# Patient Record
Sex: Male | Born: 1970 | Race: White | Hispanic: No | Marital: Single | Smoking: Current every day smoker
Health system: Southern US, Community
[De-identification: ages and names within clinical notes are randomized; demographics above are authoritative.]

## PROBLEM LIST (undated history)

## (undated) DIAGNOSIS — F1010 Alcohol abuse, uncomplicated: Secondary | ICD-10-CM

## (undated) DIAGNOSIS — K7460 Unspecified cirrhosis of liver: Secondary | ICD-10-CM

## (undated) DIAGNOSIS — K922 Gastrointestinal hemorrhage, unspecified: Secondary | ICD-10-CM

## (undated) DIAGNOSIS — I10 Essential (primary) hypertension: Secondary | ICD-10-CM

## (undated) HISTORY — PX: TONSILLECTOMY: SUR1361

---

## 1997-08-27 ENCOUNTER — Inpatient Hospital Stay (HOSPITAL_COMMUNITY): Admission: EM | Admit: 1997-08-27 | Discharge: 1997-09-01 | Payer: Self-pay | Admitting: Emergency Medicine

## 2007-10-26 ENCOUNTER — Emergency Department (HOSPITAL_COMMUNITY): Admission: EM | Admit: 2007-10-26 | Discharge: 2007-10-26 | Payer: Self-pay | Admitting: Family Medicine

## 2009-06-05 ENCOUNTER — Emergency Department (HOSPITAL_COMMUNITY): Admission: EM | Admit: 2009-06-05 | Discharge: 2009-06-05 | Payer: Self-pay | Admitting: Emergency Medicine

## 2009-08-10 ENCOUNTER — Encounter: Admission: RE | Admit: 2009-08-10 | Discharge: 2009-08-10 | Payer: Self-pay | Admitting: Internal Medicine

## 2009-08-18 ENCOUNTER — Inpatient Hospital Stay (HOSPITAL_COMMUNITY): Admission: AD | Admit: 2009-08-18 | Discharge: 2009-08-22 | Payer: Self-pay | Admitting: Internal Medicine

## 2010-07-18 LAB — CBC
HCT: 38.7 % — ABNORMAL LOW (ref 39.0–52.0)
HCT: 41.2 % (ref 39.0–52.0)
HCT: 45.9 % (ref 39.0–52.0)
Hemoglobin: 15.9 g/dL (ref 13.0–17.0)
MCHC: 34.6 g/dL (ref 30.0–36.0)
MCHC: 35 g/dL (ref 30.0–36.0)
MCV: 103.7 fL — ABNORMAL HIGH (ref 78.0–100.0)
Platelets: 113 10*3/uL — ABNORMAL LOW (ref 150–400)
Platelets: 79 10*3/uL — ABNORMAL LOW (ref 150–400)
RBC: 4.42 MIL/uL (ref 4.22–5.81)
RDW: 14.5 % (ref 11.5–15.5)
RDW: 14.7 % (ref 11.5–15.5)
RDW: 14.9 % (ref 11.5–15.5)
WBC: 6.4 10*3/uL (ref 4.0–10.5)
WBC: 6.7 10*3/uL (ref 4.0–10.5)
WBC: 9.9 10*3/uL (ref 4.0–10.5)

## 2010-07-18 LAB — COMPREHENSIVE METABOLIC PANEL
ALT: 45 U/L (ref 0–53)
ALT: 48 U/L (ref 0–53)
AST: 142 U/L — ABNORMAL HIGH (ref 0–37)
AST: 75 U/L — ABNORMAL HIGH (ref 0–37)
Albumin: 2.8 g/dL — ABNORMAL LOW (ref 3.5–5.2)
Alkaline Phosphatase: 101 U/L (ref 39–117)
BUN: <1 mg/dL — ABNORMAL LOW (ref 6–23)
CO2: 29 mEq/L (ref 19–32)
Calcium: 8.5 mg/dL (ref 8.4–10.5)
Creatinine, Ser: 0.55 mg/dL (ref 0.4–1.5)
GFR calc Af Amer: >60 mL/min (ref >60)
GFR calc Af Amer: >60 mL/min (ref >60)
GFR calc Af Amer: >60 mL/min (ref >60)
Glucose, Bld: 165 mg/dL — ABNORMAL HIGH (ref 70–99)
Glucose, Bld: 181 mg/dL — ABNORMAL HIGH (ref 70–99)
Potassium: 3.4 mEq/L — ABNORMAL LOW (ref 3.5–5.1)
Potassium: 3.5 mEq/L (ref 3.5–5.1)
Potassium: 4.2 mEq/L (ref 3.5–5.1)
Sodium: 137 mEq/L (ref 135–145)
Sodium: 139 mEq/L (ref 135–145)
Total Bilirubin: 2.2 mg/dL — ABNORMAL HIGH (ref 0.3–1.2)

## 2010-07-18 LAB — GLUCOSE, CAPILLARY
Glucose-Capillary: 150 mg/dL — ABNORMAL HIGH (ref 70–99)
Glucose-Capillary: 150 mg/dL — ABNORMAL HIGH (ref 70–99)
Glucose-Capillary: 167 mg/dL — ABNORMAL HIGH (ref 70–99)
Glucose-Capillary: 172 mg/dL — ABNORMAL HIGH (ref 70–99)
Glucose-Capillary: 188 mg/dL — ABNORMAL HIGH (ref 70–99)
Glucose-Capillary: 257 mg/dL — ABNORMAL HIGH (ref 70–99)

## 2010-07-18 LAB — BASIC METABOLIC PANEL
CO2: 29 mEq/L (ref 19–32)
Calcium: 7.7 mg/dL — ABNORMAL LOW (ref 8.4–10.5)
Creatinine, Ser: 0.64 mg/dL (ref 0.4–1.5)
GFR calc non Af Amer: >60 mL/min (ref >60)
Sodium: 134 mEq/L — ABNORMAL LOW (ref 135–145)

## 2010-07-18 LAB — PROTIME-INR
INR: 1.27 (ref 0.00–1.49)
Prothrombin Time: 15.8 seconds — ABNORMAL HIGH (ref 11.6–15.2)

## 2010-07-18 LAB — MAGNESIUM: Magnesium: 1.2 mg/dL — ABNORMAL LOW (ref 1.5–2.5)

## 2010-07-20 LAB — DIFFERENTIAL
Eosinophils Relative: 0 % (ref 0–5)
Lymphs Abs: 2.9 10*3/uL (ref 0.7–4.0)
Monocytes Relative: 5 % (ref 3–12)
Neutrophils Relative %: 70 % (ref 43–77)

## 2010-07-20 LAB — CBC: Platelets: 292 10*3/uL (ref 150–400)

## 2010-07-20 LAB — COMPREHENSIVE METABOLIC PANEL
ALT: 52 U/L (ref 0–53)
AST: 128 U/L — ABNORMAL HIGH (ref 0–37)
BUN: 2 mg/dL — ABNORMAL LOW (ref 6–23)
Calcium: 7.7 mg/dL — ABNORMAL LOW (ref 8.4–10.5)
Chloride: 99 mEq/L (ref 96–112)
Creatinine, Ser: 0.52 mg/dL (ref 0.4–1.5)
GFR calc Af Amer: >60 mL/min (ref >60)
Glucose, Bld: 259 mg/dL — ABNORMAL HIGH (ref 70–99)

## 2010-07-20 LAB — URINALYSIS, ROUTINE W REFLEX MICROSCOPIC
Bilirubin Urine: NEGATIVE
Hgb urine dipstick: NEGATIVE
Ketones, ur: NEGATIVE mg/dL
Specific Gravity, Urine: 1.012 (ref 1.005–1.030)
pH: 6.5 (ref 5.0–8.0)

## 2010-07-20 LAB — ETHANOL: Alcohol, Ethyl (B): 371 mg/dL — ABNORMAL HIGH (ref 0–10)

## 2015-06-21 ENCOUNTER — Inpatient Hospital Stay (HOSPITAL_COMMUNITY): Payer: Self-pay

## 2015-06-21 ENCOUNTER — Inpatient Hospital Stay (HOSPITAL_COMMUNITY)
Admission: EM | Admit: 2015-06-21 | Discharge: 2015-06-24 | DRG: 378 | Disposition: A | Discharge status: Home / Self Care | Payer: Self-pay | Attending: Internal Medicine | Admitting: Internal Medicine

## 2015-06-21 ENCOUNTER — Encounter (HOSPITAL_COMMUNITY): Payer: Self-pay | Admitting: Emergency Medicine

## 2015-06-21 DIAGNOSIS — K922 Gastrointestinal hemorrhage, unspecified: Secondary | ICD-10-CM | POA: Diagnosis present

## 2015-06-21 DIAGNOSIS — R140 Abdominal distension (gaseous): Secondary | ICD-10-CM

## 2015-06-21 DIAGNOSIS — R221 Localized swelling, mass and lump, neck: Secondary | ICD-10-CM

## 2015-06-21 DIAGNOSIS — I10 Essential (primary) hypertension: Secondary | ICD-10-CM | POA: Diagnosis present

## 2015-06-21 DIAGNOSIS — J441 Chronic obstructive pulmonary disease with (acute) exacerbation: Secondary | ICD-10-CM

## 2015-06-21 DIAGNOSIS — K7031 Alcoholic cirrhosis of liver with ascites: Secondary | ICD-10-CM | POA: Diagnosis present

## 2015-06-21 DIAGNOSIS — I8500 Esophageal varices without bleeding: Secondary | ICD-10-CM | POA: Diagnosis present

## 2015-06-21 DIAGNOSIS — R748 Abnormal levels of other serum enzymes: Secondary | ICD-10-CM

## 2015-06-21 DIAGNOSIS — R188 Other ascites: Secondary | ICD-10-CM

## 2015-06-21 DIAGNOSIS — R000 Tachycardia, unspecified: Secondary | ICD-10-CM | POA: Diagnosis present

## 2015-06-21 DIAGNOSIS — D689 Coagulation defect, unspecified: Secondary | ICD-10-CM | POA: Diagnosis present

## 2015-06-21 DIAGNOSIS — F17210 Nicotine dependence, cigarettes, uncomplicated: Secondary | ICD-10-CM | POA: Diagnosis present

## 2015-06-21 DIAGNOSIS — E8809 Other disorders of plasma-protein metabolism, not elsewhere classified: Secondary | ICD-10-CM | POA: Diagnosis present

## 2015-06-21 DIAGNOSIS — K766 Portal hypertension: Secondary | ICD-10-CM | POA: Diagnosis present

## 2015-06-21 DIAGNOSIS — Y908 Blood alcohol level of 240 mg/100 ml or more: Secondary | ICD-10-CM | POA: Diagnosis present

## 2015-06-21 DIAGNOSIS — K921 Melena: Principal | ICD-10-CM | POA: Diagnosis present

## 2015-06-21 DIAGNOSIS — R180 Malignant ascites: Secondary | ICD-10-CM

## 2015-06-21 DIAGNOSIS — Z8249 Family history of ischemic heart disease and other diseases of the circulatory system: Secondary | ICD-10-CM

## 2015-06-21 DIAGNOSIS — R55 Syncope and collapse: Secondary | ICD-10-CM | POA: Diagnosis present

## 2015-06-21 DIAGNOSIS — D539 Nutritional anemia, unspecified: Secondary | ICD-10-CM | POA: Diagnosis present

## 2015-06-21 DIAGNOSIS — F1010 Alcohol abuse, uncomplicated: Secondary | ICD-10-CM | POA: Diagnosis present

## 2015-06-21 DIAGNOSIS — K208 Other esophagitis: Secondary | ICD-10-CM | POA: Diagnosis present

## 2015-06-21 DIAGNOSIS — K3189 Other diseases of stomach and duodenum: Secondary | ICD-10-CM | POA: Diagnosis present

## 2015-06-21 DIAGNOSIS — D696 Thrombocytopenia, unspecified: Secondary | ICD-10-CM | POA: Diagnosis present

## 2015-06-21 DIAGNOSIS — F1290 Cannabis use, unspecified, uncomplicated: Secondary | ICD-10-CM | POA: Diagnosis present

## 2015-06-21 DIAGNOSIS — R0602 Shortness of breath: Secondary | ICD-10-CM

## 2015-06-21 DIAGNOSIS — R739 Hyperglycemia, unspecified: Secondary | ICD-10-CM

## 2015-06-21 HISTORY — DX: Essential (primary) hypertension: I10

## 2015-06-21 LAB — CBC WITH DIFFERENTIAL/PLATELET
BASOS ABS: 0 10*3/uL (ref 0.0–0.1)
BASOS PCT: 0 %
EOS ABS: 0 10*3/uL (ref 0.0–0.7)
EOS PCT: 0 %
HCT: 36.5 % — ABNORMAL LOW (ref 39.0–52.0)
Hemoglobin: 12 g/dL — ABNORMAL LOW (ref 13.0–17.0)
Lymphocytes Relative: 12 %
Lymphs Abs: 1.3 10*3/uL (ref 0.7–4.0)
MCH: 34.8 pg — ABNORMAL HIGH (ref 26.0–34.0)
MCHC: 32.9 g/dL (ref 30.0–36.0)
MCV: 105.8 fL — ABNORMAL HIGH (ref 78.0–100.0)
MONO ABS: 0.8 10*3/uL (ref 0.1–1.0)
MONOS PCT: 7 %
Neutro Abs: 8.7 10*3/uL — ABNORMAL HIGH (ref 1.7–7.7)
Neutrophils Relative %: 81 %
PLATELETS: 175 10*3/uL (ref 150–400)
RBC: 3.45 MIL/uL — ABNORMAL LOW (ref 4.22–5.81)
RDW: 15.2 % (ref 11.5–15.5)
WBC: 10.7 10*3/uL — ABNORMAL HIGH (ref 4.0–10.5)

## 2015-06-21 LAB — BODY FLUID CELL COUNT WITH DIFFERENTIAL
LYMPHS FL: 9 %
MONOCYTE-MACROPHAGE-SEROUS FLUID: 89 % (ref 50–90)
NEUTROPHIL FLUID: 2 % (ref 0–25)
WBC FLUID: 144 uL (ref 0–1000)

## 2015-06-21 LAB — COMPREHENSIVE METABOLIC PANEL
ALT: 34 U/L (ref 17–63)
AST: 115 U/L — AB (ref 15–41)
Albumin: 2.9 g/dL — ABNORMAL LOW (ref 3.5–5.0)
Alkaline Phosphatase: 114 U/L (ref 38–126)
Anion gap: 14 (ref 5–15)
BUN: 23 mg/dL — AB (ref 6–20)
CHLORIDE: 99 mmol/L — AB (ref 101–111)
CO2: 23 mmol/L (ref 22–32)
CREATININE: 1.02 mg/dL (ref 0.61–1.24)
Calcium: 7.8 mg/dL — ABNORMAL LOW (ref 8.9–10.3)
GFR calc Af Amer: >60 mL/min (ref >60)
GFR calc non Af Amer: >60 mL/min (ref >60)
GLUCOSE: 198 mg/dL — AB (ref 65–99)
Potassium: 3.8 mmol/L (ref 3.5–5.1)
SODIUM: 136 mmol/L (ref 135–145)
Total Bilirubin: 2.7 mg/dL — ABNORMAL HIGH (ref 0.3–1.2)
Total Protein: 7.3 g/dL (ref 6.5–8.1)

## 2015-06-21 LAB — CBC
HCT: 33.6 % — ABNORMAL LOW (ref 39.0–52.0)
Hemoglobin: 11.3 g/dL — ABNORMAL LOW (ref 13.0–17.0)
MCH: 35.4 pg — ABNORMAL HIGH (ref 26.0–34.0)
MCHC: 33.6 g/dL (ref 30.0–36.0)
MCV: 105.3 fL — AB (ref 78.0–100.0)
PLATELETS: 127 10*3/uL — AB (ref 150–400)
RBC: 3.19 MIL/uL — AB (ref 4.22–5.81)
RDW: 15.1 % (ref 11.5–15.5)
WBC: 9.1 10*3/uL (ref 4.0–10.5)

## 2015-06-21 LAB — ETHANOL: ALCOHOL ETHYL (B): 369 mg/dL — AB (ref <5)

## 2015-06-21 LAB — TSH: TSH: 0.37 u[IU]/mL (ref 0.350–4.500)

## 2015-06-21 LAB — TYPE AND SCREEN
ABO/RH(D): A POS
ANTIBODY SCREEN: NEGATIVE

## 2015-06-21 LAB — RAPID URINE DRUG SCREEN, HOSP PERFORMED
Amphetamines: NOT DETECTED
Barbiturates: NOT DETECTED
Benzodiazepines: NOT DETECTED
COCAINE: NOT DETECTED
OPIATES: NOT DETECTED
TETRAHYDROCANNABINOL: POSITIVE — AB

## 2015-06-21 LAB — PROTIME-INR
INR: 1.67 — ABNORMAL HIGH (ref 0.00–1.49)
Prothrombin Time: 19.1 seconds — ABNORMAL HIGH (ref 11.6–15.2)

## 2015-06-21 LAB — TROPONIN I: Troponin I: <0.03 ng/mL (ref <0.031)

## 2015-06-21 LAB — GRAM STAIN

## 2015-06-21 LAB — APTT: APTT: 34 s (ref 24–37)

## 2015-06-21 LAB — MRSA PCR SCREENING: MRSA BY PCR: POSITIVE — AB

## 2015-06-21 LAB — ALBUMIN, FLUID (OTHER): Albumin, Fluid: <1 g/dL

## 2015-06-21 LAB — MAGNESIUM: Magnesium: 1.4 mg/dL — ABNORMAL LOW (ref 1.7–2.4)

## 2015-06-21 LAB — LIPASE, BLOOD: Lipase: 49 U/L (ref 11–51)

## 2015-06-21 LAB — ABO/RH: ABO/RH(D): A POS

## 2015-06-21 MED ORDER — IPRATROPIUM BROMIDE 0.02 % IN SOLN
0.5000 mg | Freq: Four times a day (QID) | RESPIRATORY_TRACT | Status: DC
  Filled 2015-06-21: qty 2.5

## 2015-06-21 MED ORDER — SODIUM CHLORIDE 0.9 % IV SOLN
8.0000 mg/h | INTRAVENOUS | Status: DC
  Administered 2015-06-21 – 2015-06-23 (×4): 8 mg/h via INTRAVENOUS
  Filled 2015-06-21 (×8): qty 80

## 2015-06-21 MED ORDER — SODIUM CHLORIDE 0.9 % IV BOLUS (SEPSIS)
1000.0000 mL | Freq: Once | INTRAVENOUS | Status: AC
  Administered 2015-06-21: 1000 mL via INTRAVENOUS

## 2015-06-21 MED ORDER — ALBUMIN HUMAN 25 % IV SOLN
25.0000 g | Freq: Once | INTRAVENOUS | Status: DC
Start: 2015-06-21 — End: 2015-06-21

## 2015-06-21 MED ORDER — ALBUMIN HUMAN 25 % IV SOLN: 12.5000 g | Freq: Once | INTRAVENOUS | Status: DC

## 2015-06-21 MED ORDER — FOLIC ACID 1 MG PO TABS
1.0000 mg | ORAL_TABLET | Freq: Every day | ORAL | Status: DC
  Administered 2015-06-22 – 2015-06-24 (×2): 1 mg via ORAL
  Filled 2015-06-21 (×2): qty 1

## 2015-06-21 MED ORDER — LEVALBUTEROL HCL 0.63 MG/3ML IN NEBU: 0.6300 mg | INHALATION_SOLUTION | Freq: Four times a day (QID) | RESPIRATORY_TRACT | Status: DC | PRN

## 2015-06-21 MED ORDER — GUAIFENESIN-DM 100-10 MG/5ML PO SYRP: 5.0000 mL | ORAL_SOLUTION | ORAL | Status: DC | PRN

## 2015-06-21 MED ORDER — VITAMIN B-1 100 MG PO TABS
100.0000 mg | ORAL_TABLET | Freq: Every day | ORAL | Status: DC
  Administered 2015-06-22 – 2015-06-24 (×2): 100 mg via ORAL
  Filled 2015-06-21 (×2): qty 1

## 2015-06-21 MED ORDER — ONDANSETRON HCL 4 MG/2ML IJ SOLN
INTRAMUSCULAR | Status: AC
  Filled 2015-06-21: qty 2

## 2015-06-21 MED ORDER — ALBUMIN HUMAN 25 % IV SOLN
37.5000 g | Freq: Once | INTRAVENOUS | Status: AC
  Administered 2015-06-21: 37.5 g via INTRAVENOUS
  Filled 2015-06-21: qty 50

## 2015-06-21 MED ORDER — ONDANSETRON HCL 4 MG PO TABS: 4.0000 mg | ORAL_TABLET | Freq: Four times a day (QID) | ORAL | Status: DC | PRN

## 2015-06-21 MED ORDER — DEXTROSE 5 % IV SOLN
2.0000 g | INTRAVENOUS | Status: DC
  Administered 2015-06-21: 2 g via INTRAVENOUS
  Filled 2015-06-21 (×2): qty 2

## 2015-06-21 MED ORDER — IOHEXOL 300 MG/ML  SOLN
100.0000 mL | Freq: Once | INTRAMUSCULAR | Status: AC | PRN
  Administered 2015-06-21: 100 mL via INTRAVENOUS

## 2015-06-21 MED ORDER — ONDANSETRON HCL 4 MG/2ML IJ SOLN: 4.0000 mg | Freq: Four times a day (QID) | INTRAMUSCULAR | Status: DC | PRN

## 2015-06-21 MED ORDER — IOHEXOL 300 MG/ML  SOLN
25.0000 mL | Freq: Once | INTRAMUSCULAR | Status: AC | PRN
  Administered 2015-06-21: 25 mL via ORAL

## 2015-06-21 MED ORDER — ACETAMINOPHEN 325 MG PO TABS: 650.0000 mg | ORAL_TABLET | Freq: Four times a day (QID) | ORAL | Status: DC | PRN

## 2015-06-21 MED ORDER — LORAZEPAM 2 MG/ML IJ SOLN
1.0000 mg | Freq: Four times a day (QID) | INTRAMUSCULAR | Status: DC | PRN
  Administered 2015-06-21 – 2015-06-23 (×2): 1 mg via INTRAVENOUS
  Filled 2015-06-21 (×2): qty 1

## 2015-06-21 MED ORDER — CHLORHEXIDINE GLUCONATE CLOTH 2 % EX PADS
6.0000 | MEDICATED_PAD | Freq: Every day | CUTANEOUS | Status: DC
Start: 2015-06-22 — End: 2015-06-24
  Administered 2015-06-24: 6 via TOPICAL

## 2015-06-21 MED ORDER — THIAMINE HCL 100 MG/ML IJ SOLN
100.0000 mg | Freq: Every day | INTRAMUSCULAR | Status: DC
  Administered 2015-06-21: 100 mg via INTRAVENOUS
  Filled 2015-06-21 (×2): qty 2

## 2015-06-21 MED ORDER — MUPIROCIN 2 % EX OINT
1.0000 "application " | TOPICAL_OINTMENT | Freq: Two times a day (BID) | CUTANEOUS | Status: DC
  Administered 2015-06-22 – 2015-06-24 (×5): 1 via NASAL
  Filled 2015-06-21 (×3): qty 22

## 2015-06-21 MED ORDER — SODIUM CHLORIDE 0.9 % IV SOLN
80.0000 mg | Freq: Once | INTRAVENOUS | Status: AC
  Administered 2015-06-21: 80 mg via INTRAVENOUS
  Filled 2015-06-21: qty 80

## 2015-06-21 MED ORDER — DEXTROSE 5 % IV SOLN: 2.0000 g | Freq: Once | INTRAVENOUS | Status: DC

## 2015-06-21 MED ORDER — CEFTRIAXONE SODIUM 2 G IJ SOLR: 2.0000 g | INTRAMUSCULAR | Status: DC

## 2015-06-21 MED ORDER — LORAZEPAM 1 MG PO TABS
1.0000 mg | ORAL_TABLET | Freq: Four times a day (QID) | ORAL | Status: DC | PRN
  Administered 2015-06-21: 1 mg via ORAL
  Filled 2015-06-21: qty 1

## 2015-06-21 MED ORDER — METHYLPREDNISOLONE SODIUM SUCC 40 MG IJ SOLR
40.0000 mg | Freq: Two times a day (BID) | INTRAMUSCULAR | Status: DC
  Administered 2015-06-21 – 2015-06-22 (×3): 40 mg via INTRAVENOUS
  Filled 2015-06-21 (×3): qty 1

## 2015-06-21 MED ORDER — DEXTROSE 5 % IV SOLN
2.0000 g | INTRAVENOUS | Status: DC
  Filled 2015-06-21: qty 2

## 2015-06-21 MED ORDER — OCTREOTIDE LOAD VIA INFUSION
50.0000 ug | Freq: Once | INTRAVENOUS | Status: AC
Start: 2015-06-21 — End: 2015-06-21
  Administered 2015-06-21: 50 ug via INTRAVENOUS
  Filled 2015-06-21: qty 25

## 2015-06-21 MED ORDER — NICOTINE 7 MG/24HR TD PT24
7.0000 mg | MEDICATED_PATCH | Freq: Every day | TRANSDERMAL | Status: DC
  Administered 2015-06-21 – 2015-06-23 (×3): 7 mg via TRANSDERMAL
  Filled 2015-06-21 (×4): qty 1

## 2015-06-21 MED ORDER — OXYCODONE HCL 5 MG PO TABS
5.0000 mg | ORAL_TABLET | ORAL | Status: DC | PRN
  Administered 2015-06-21 (×2): 5 mg via ORAL
  Filled 2015-06-21 (×2): qty 1

## 2015-06-21 MED ORDER — SODIUM CHLORIDE 0.9% FLUSH
3.0000 mL | Freq: Two times a day (BID) | INTRAVENOUS | Status: DC
  Administered 2015-06-21 – 2015-06-24 (×6): 3 mL via INTRAVENOUS

## 2015-06-21 MED ORDER — ALBUMIN HUMAN 25 % IV SOLN
50.0000 g | Freq: Once | INTRAVENOUS | Status: AC
  Administered 2015-06-21: 50 g via INTRAVENOUS
  Filled 2015-06-21: qty 200

## 2015-06-21 MED ORDER — PANTOPRAZOLE SODIUM 40 MG IV SOLR
40.0000 mg | Freq: Two times a day (BID) | INTRAVENOUS | Status: DC
Start: 2015-06-21 — End: 2015-06-21
  Administered 2015-06-21: 40 mg via INTRAVENOUS
  Filled 2015-06-21: qty 40

## 2015-06-21 MED ORDER — ONDANSETRON HCL 4 MG/2ML IJ SOLN
4.0000 mg | Freq: Once | INTRAMUSCULAR | Status: AC
  Administered 2015-06-21: 4 mg via INTRAVENOUS

## 2015-06-21 MED ORDER — OCTREOTIDE ACETATE 500 MCG/ML IJ SOLN
50.0000 ug/h | INTRAMUSCULAR | Status: DC
  Administered 2015-06-21 – 2015-06-22 (×4): 50 ug/h via INTRAVENOUS
  Filled 2015-06-21 (×10): qty 1

## 2015-06-21 MED ORDER — GUAIFENESIN ER 600 MG PO TB12
600.0000 mg | ORAL_TABLET | Freq: Two times a day (BID) | ORAL | Status: DC
  Administered 2015-06-21 – 2015-06-24 (×5): 600 mg via ORAL
  Filled 2015-06-21 (×5): qty 1

## 2015-06-21 MED ORDER — SODIUM CHLORIDE 0.9 % IV SOLN
INTRAVENOUS | Status: DC
  Administered 2015-06-21: 12:00:00 via INTRAVENOUS
  Administered 2015-06-22: 1000 mL via INTRAVENOUS

## 2015-06-21 MED ORDER — PANTOPRAZOLE SODIUM 40 MG IV SOLR
40.0000 mg | Freq: Two times a day (BID) | INTRAVENOUS | Status: DC
Start: 2015-06-25 — End: 2015-06-21

## 2015-06-21 MED ORDER — SODIUM CHLORIDE 0.9 % IV BOLUS (SEPSIS)
250.0000 mL | Freq: Once | INTRAVENOUS | Status: DC
Start: 2015-06-21 — End: 2015-06-24

## 2015-06-21 MED ORDER — ADULT MULTIVITAMIN W/MINERALS CH
1.0000 | ORAL_TABLET | Freq: Every day | ORAL | Status: DC
  Administered 2015-06-22 – 2015-06-24 (×2): 1 via ORAL
  Filled 2015-06-21 (×2): qty 1

## 2015-06-21 MED ORDER — ACETAMINOPHEN 650 MG RE SUPP
650.0000 mg | Freq: Four times a day (QID) | RECTAL | Status: DC | PRN
Start: 2015-06-21 — End: 2015-06-24

## 2015-06-21 NOTE — ED Notes (Signed)
Pt ambulated to restroom. Was unaware he needed a urine sample. Pt will notify when he is able to void again.

## 2015-06-21 NOTE — H&P (Addendum)
[facility] History and Physical  JAQUES MINEER JYN:[id number] DOB: May 12, 1970 DOA: 06/21/2015  Referring physician: *  PCP: No primary care provider on file.   Chief Complaint: Shortness of breath   HPI:  45 year old male with a history of alcoholism, nicotine dependence, brought in via EMS because of a near-syncopal episode, after he had coffee-ground emesis that started 2-3 days ago. Patient has also noted melena for the last 1 week. He is also noticed increasing abdominal distention for the last several weeks. He also complains of exertional dyspnea and wheezing. He smokes about 1 pack a day. Patient was initially hypertensive with a blood pressure in the 180s, subsequently blood pressure was 132 /95. Patient found to be tachycardic in the 140s. Patient denies any recent weight loss. Patient has also noticed a lump along the right side of his neck which has been increasing in size for the last couple of years. Patient admits to drinking on a regular basis and mostly drinks beer and vodka. Patient found to be tachycardic but otherwise hemodynamically stable, hemoglobin 12.0, creatinine 1.02, bilirubin 2.7. White blood cell count 10.7. EtOH level 369. INR 1.67     Review of Systems: negative for the following  Gen: Denies any fever, chills, rigors, night sweats, anorexia, fatigue, weakness, malaise, involuntary weight loss, and sleep disorder CV: Denies chest pain, angina, palpitations, syncope, orthopnea, PND, peripheral edema, and claudication. Resp: Denies dyspnea, cough, sputum, wheezing, coughing up blood. GI: Described in detail in HPI.  GU : Denies urinary burning, blood in urine, urinary frequency, urinary hesitancy, nocturnal urination, and urinary incontinence. MS: Denies joint pain or swelling. Denies muscle weakness, cramps, atrophy.  Derm: Denies rash, itching, oral ulcerations, hives, unhealing ulcers.  Psych: Denies depression, anxiety, memory loss, suicidal  ideation, hallucinations, and confusion. Heme: Denies bruising, bleeding, and enlarged lymph nodes. Neuro: Denies any headaches, dizziness, paresthesias. Endo: Denies any problems with DM, thyroid, adrenal function   Past Medical History  Diagnosis Date  . Hypertension      Past Surgical History  Procedure Laterality Date  . Tonsillectomy        Social History:  reports that he has been smoking Cigarettes.  He has never used smokeless tobacco. He reports that he drinks alcohol. He reports that he uses illicit drugs (Marijuana).    No Known Allergies  Family History  Problem Relation Age of Onset  . Hypertension Father          Prior to Admission medications   Medication Sig Start Date End Date Taking? Authorizing Provider  diphenhydrAMINE (SOMINEX) 25 MG tablet Take 50 mg by mouth at bedtime as needed for sleep.   Yes Historical Provider, MD     Physical Exam: Filed Vitals:   06/21/15 1118 06/21/15 1126 06/21/15 1359  BP: 129/99  112/78  Pulse: 115 160 144  Temp: 98.1 F (36.7 C)  97.8 F (36.6 C)  TempSrc: Oral  Oral  Resp: 22  30  SpO2: 98%  100%     Constitutional: Vital signs reviewed. Patient is a well-developed and well-nourished in no acute distress and cooperative with exam. Alert and oriented x3.  Head: Normocephalic and atraumatic  Ear: TM normal bilaterally  Mouth: no erythema or exudates, MMM  Eyes: PERRL, EOMI, conjunctivae normal, No scleral icterus.  Neck: Supple, Trachea midline normal ROM, No JVD, mass, thyromegaly, or carotid bruit present.  Cardiovascular: Tachycardic, S1 normal, S2 normal, no MRG, pulses symmetric and intact bilaterally  Pulmonary/Chest: CTAB, no wheezes, rales, or  rhonchi  Abdominal: Soft. Non-tender, non-distended, bowel sounds are normal, no masses, organomegaly, or guarding present.  GU: no CVA tenderness Musculoskeletal: No joint deformities, erythema, or stiffness, ROM full and no nontender Ext: no edema and  no cyanosis, pulses palpable bilaterally (DP and PT)  Hematology: no cervical, inginal, or axillary adenopathy.  Neurological: A&O x3, Strenght is normal and symmetric bilaterally, cranial nerve II-XII are grossly intact, no focal motor deficit, sensory intact to light touch bilaterally.  Skin: Multiple tattoos, scattered ecchymoses, truncal telangiectasias  Psychiatric: Normal mood and affect. speech and behavior is normal. Judgment and thought content normal. Cognition and memory are normal.      Data Review   Micro Results No results found for this or any previous visit (from the past 240 hour(s)).  Radiology Reports No results found.   CBC  Recent Labs Lab 06/21/15 1208  WBC 10.7*  HGB 12.0*  HCT 36.5*  PLT 175  MCV 105.8*  MCH 34.8*  MCHC 32.9  RDW 15.2  LYMPHSABS 1.3  MONOABS 0.8  EOSABS 0.0  BASOSABS 0.0    Chemistries   Recent Labs Lab 06/21/15 1200  NA 136  K 3.8  CL 99*  CO2 23  GLUCOSE 198*  BUN 23*  CREATININE 1.02  CALCIUM 7.8*  AST 115*  ALT 34  ALKPHOS 114  BILITOT 2.7*   ------------------------------------------------------------------------------------------------------------------ CrCl cannot be calculated (Unknown ideal weight.). ------------------------------------------------------------------------------------------------------------------ No results for input(s): HGBA1C in the last 72 hours. ------------------------------------------------------------------------------------------------------------------ No results for input(s): CHOL, HDL, LDLCALC, TRIG, CHOLHDL, LDLDIRECT in the last 72 hours. ------------------------------------------------------------------------------------------------------------------ No results for input(s): TSH, T4TOTAL, T3FREE, THYROIDAB in the last 72 hours.  Invalid input(s): FREET3 ------------------------------------------------------------------------------------------------------------------ No  results for input(s): VITAMINB12, FOLATE, FERRITIN, TIBC, IRON, RETICCTPCT in the last 72 hours.  Coagulation profile  Recent Labs Lab 06/21/15 1200  INR 1.67*    No results for input(s): DDIMER in the last 72 hours.  Cardiac Enzymes No results for input(s): CKMB, TROPONINI, MYOGLOBIN in the last 168 hours.  Invalid input(s): CK ------------------------------------------------------------------------------------------------------------------ Invalid input(s): POCBNP   CBG: No results for input(s): GLUCAP in the last 168 hours.     EKG: Independently reviewed.  *   Assessment/Plan Principal Problem:   Acute GI bleeding Patient presents with coffee-ground emesis, likely secondary to esophagitis versus portal hypertensive gastropathy in the setting of daily alcohol use Patient will be admitted to step down   and patient has been initiated on octreotide and Protonix drip Serial CBC, if hemoglobin drops less than 10 we'll transfuse in the setting of ongoing melena Gastroenterology, Dr. Dulce Sellar has been consulted  CT of the abdomen and pelvis to evaluate for cirrhosis, portal hypertension  Ascites likely secondary to alcoholic cirrhosis Patient started on Rocephin for SBP prophylaxis Dr. Dulce Sellar recommends albumin administration followed by paracentesis Check the sciatic fluid cell count, differential, culture, cytology  Alcohol abuse We'll start the patient on CIWA protocol   Macrocytic anemia, thrombocytopenia Likely in the setting of alcohol use, avoid heparin products  Coagulopathy of the liver Suspect likely secondary to alcoholic cirrhosis of the liver Follow PT/INR  Acute COPD exacerbation Patient will be started on IV steroids, nebulizer treatments Continue empiric Rocephin CT of the chest to evaluate for underlying lung mass, possible pulmonary embolism  Lump in the neck CT of the head and neck to rule out underlying malignancy         Code Status  Orders        Start     Ordered  06/21/15 1441  Full code   Continuous     06/21/15 1441    Code Status History    Date Active Date Inactive Code Status Order ID Comments User Context   This patient has a current code status but no historical code status.      Family Communication: Discussed with the patient's mother and the patient by the bedside Disposition Plan: admit   Total time spent 55 minutes.Greater than 50% of this time was spent in counseling, explanation of diagnosis, planning of further management, and coordination of care  [facility] Pager [phone]  If 7PM-7AM, please contact night-coverage [email or web address] Password [facility] 06/21/2015, 2:44 PM

## 2015-06-21 NOTE — Consult Note (Signed)
[facility] Consultation Note  Referring Provider: [facility] Primary Care Physician:  No primary care provider on file.  Reason for Consultation:  Abdominal distention, coffee ground emesis, melena  HPI: Derrick [name] is a 45 y.o. male whom we've been asked to see for above reasons.  He actually presented to ED because of a three-week duration of abdominal distention and discomfort.  He also endorses 3-day history of black emesis and black stools.  No frankly red hematemesis or hematochezia.  He has been heavy alcohol consumer for over 20 years.  Chronic back and generalized pain, and takes NSAIDs occasionally.  Denies prior liver disease or endoscopy or colonoscopy.  Denies fevers or unintentional weight loss.  Labs available below; many other labs still pending.   Past Medical History  Diagnosis Date  . Hypertension     History reviewed. No pertinent past surgical history.  Prior to Admission medications   Medication Sig Start Date End Date Taking? Authorizing Provider  diphenhydrAMINE (SOMINEX) 25 MG tablet Take 50 mg by mouth at bedtime as needed for sleep.   Yes Historical Provider, MD    Current Facility-Administered Medications  Medication Dose Route Frequency Provider Last Rate Last Dose  . 0.9 %  sodium chloride infusion   Intravenous Continuous Lorre Nick, MD 125 mL/hr at 06/21/15 1159    . octreotide (SANDOSTATIN) 500 mcg in sodium chloride 0.9 % 250 mL (2 mcg/mL) infusion  50 mcg/hr Intravenous Continuous Lorre Nick, MD 25 mL/hr at 06/21/15 1234 50 mcg/hr at 06/21/15 1234   Current Outpatient Prescriptions  Medication Sig Dispense Refill  . diphenhydrAMINE (SOMINEX) 25 MG tablet Take 50 mg by mouth at bedtime as needed for sleep.      Allergies as of 06/21/2015  . (No Known Allergies)    History reviewed. No pertinent family history.  Social History   Social History  . Marital Status: Single    Spouse Name: N/A  . Number of Children:  N/A  . Years of Education: N/A   Occupational History  . Not on file.   Social History Main Topics  . Smoking status: Not on file  . Smokeless tobacco: Not on file  . Alcohol Use: Not on file  . Drug Use: Not on file  . Sexual Activity: Not on file   Other Topics Concern  . Not on file   Social History Narrative  . No narrative on file    Review of Systems: Positive = bold Gen: Denies any fever, chills, rigors, night sweats, anorexia, fatigue, weakness, malaise, involuntary weight loss, and sleep disorder CV: Denies chest pain, angina, palpitations, syncope, orthopnea, PND, peripheral edema, and claudication. Resp: Denies dyspnea, cough, sputum, wheezing, coughing up blood. GI: Described in detail in HPI.    GU : Denies urinary burning, blood in urine, urinary frequency, urinary hesitancy, nocturnal urination, and urinary incontinence. MS: Denies joint pain or swelling.  Denies muscle weakness, cramps, atrophy.  Derm: Denies rash, itching, oral ulcerations, hives, unhealing ulcers.  Psych: Denies depression, anxiety, memory loss, suicidal ideation, hallucinations,  and confusion. Heme: Denies bruising, bleeding, and enlarged lymph nodes. Neuro:  Denies any headaches, dizziness, paresthesias. Endo:  Denies any problems with DM, thyroid, adrenal function.  Physical Exam: Vital signs in last 24 hours: Temp:  [98.1 F (36.7 C)] 98.1 F (36.7 C) (02/21 1118) Pulse Rate:  [115-160] 160 (02/21 1126) Resp:  [22] 22 (02/21 1118) BP: (129)/(99) 129/99 mmHg (02/21 1118) SpO2:  [98 %] 98 % (02/21 1118)  General:   Alert, smells of alcohol, Older-appearing than stated age, deconditioned and disheveled-appearing Head:  Normocephalic and atraumatic. Eyes:  Sclera clear, no icterus.   Conjunctiva pink. Ears:  Normal auditory acuity. Nose:  No deformity, discharge,  or lesions. Mouth:  No deformity or lesions.  Oropharynx pink but dry Neck:  Supple; no masses or thyromegaly. Lungs:   Clear throughout to auscultation.   No wheezes, crackles, or rhonchi. No acute distress. Heart:  Tachycardic but regular; no murmurs, clicks, rubs,  or gallops. Abdomen:  Soft, moderately distended, no tympany, + shifting dullness noted, small reducible umbilical hernia. No masses, hepatosplenomegaly or hernias noted. Bowel sounds noted   Msk:  Symmetrical without gross deformities. Normal posture. Pulses:  Normal pulses noted. Extremities:  Without clubbing or edema. Neurologic:  Alert and  oriented x4;  Diffusely weak, otherwise grossly normal neurologically. Skin:  Multiple tattoos, scattered ecchymoses, truncal telangiectasias seen, otherwise intact without significant lesions or rashes. Psych:  Alert and cooperative. Depressed mood, flat affect   Lab Results:  Recent Labs  06/21/15 1208  WBC 10.7*  HGB 12.0*  HCT 36.5*  PLT 175   BMET No results for input(s): NA, K, CL, CO2, GLUCOSE, BUN, CREATININE, CALCIUM in the last 72 hours. LFT No results for input(s): PROT, ALBUMIN, AST, ALT, ALKPHOS, BILITOT, BILIDIR, IBILI in the last 72 hours. PT/INR  Recent Labs  06/21/15 1200  LABPROT 19.1*  INR 1.67*    Studies/Results: No results found.  Impression:  1.  Coffee ground emesis. Suspect esophagitis versus portal hypertensive gastropathy.  Doubt frank variceal bleeding. 2.  Dark stools.  Same principal differential diagnosis as #1 above. 3.  Abdominal distention.  In conjunction with patient's elevated MCV, low platelets and significant alcohol history, suspect alcohol-mediated cirrhosis with ascites. 4.  Elevated LFTs.  Suspect cirrhosis, as above. 5.  Alcohol abuse.  Plan:  1.  Hydration with IV fluids. 2.  PPI and octreotide for now. 3.  No NSAIDs/blood thinners. 4.  Abdominal ultrasound to assess for changes of cirrhosis + portal hypertension, and to assess for ascites. 5.  Antibiotics for now, for presumed GI bleeding in midst of ascites. 6.  If ascites seen on  ultrasound, would obtain paracentesis for diagnostic (albumin, culture, WBC) and therapeutic purposes. 7.  No immediate need for endoscopy.  Will plan on doing endoscopy in the next 1-2 days, pending our endoscopy unit's availability. 8.  [facility] will follow.     Freddy Jaksch  06/21/2015, 1:34 PM  Pager [phone] If no answer or after 5 PM call [phone]

## 2015-06-21 NOTE — Procedures (Signed)
Ultrasound-guided diagnostic and therapeutic paracentesis performed yielding 5.1 liters of clear yellow colored fluid. No immediate complications.  Margaret Staggs E 4:20 PM 06/21/2015

## 2015-06-21 NOTE — ED Notes (Signed)
Pt transferred to IR unit for procedure, scheduled meds will be delayed.

## 2015-06-21 NOTE — ED Provider Notes (Signed)
CSN: [id number]     Arrival date & time 06/21/15  1107 History   First MD Initiated Contact with Patient 06/21/15 1134     Chief Complaint  Patient presents with  . Hematemesis  . Rectal Bleeding  . Tachycardia     (Consider location/radiation/quality/duration/timing/severity/associated sxs/prior Treatment) HPI Comments: Patient here complaining of increased abdominal distention as well as dark stools times several days. He also notes vomiting dark material. Patient is a heavy consumer of alcohol and his last drink was today which consisted of 2 large shots of vodka. Denies any alcohol withdrawal symptoms. Denies any illicit drug use. No fever or chills. Denies any severe abdominal pain. No prior history of cirrhosis or ascites. EMS was called and patient was found to be tachycardic to a rate of 130. Blood pressure was 132/95. Was given IV fluids per EMS, 150 mL of normal saline, and transported here.  Patient is a 45 y.o. male presenting with hematochezia. The history is provided by the patient.  Rectal Bleeding   Past Medical History  Diagnosis Date  . Hypertension    History reviewed. No pertinent past surgical history. History reviewed. No pertinent family history. Social History  Substance Use Topics  . Smoking status: None  . Smokeless tobacco: None  . Alcohol Use: None    Review of Systems  Gastrointestinal: Positive for hematochezia.  All other systems reviewed and are negative.     Allergies  Review of patient's allergies indicates no known allergies.  Home Medications   Prior to Admission medications   Medication Sig Start Date End Date Taking? Authorizing Provider  diphenhydrAMINE (SOMINEX) 25 MG tablet Take 50 mg by mouth at bedtime as needed for sleep.   Yes Historical Provider, MD   BP 129/99 mmHg  Pulse 115  Temp(Src) 98.1 F (36.7 C) (Oral)  Resp 22  SpO2 98% Physical Exam  Constitutional: He is oriented to person, place, and time. He appears  well-developed and well-nourished.  Non-toxic appearance. No distress.  HENT:  Head: Normocephalic and atraumatic.  Eyes: Conjunctivae, EOM and lids are normal. Pupils are equal, round, and reactive to light.  Neck: Normal range of motion. Neck supple. No tracheal deviation present. No thyroid mass present.  Cardiovascular: Regular rhythm and normal heart sounds.  Tachycardia present.  Exam reveals no gallop.   No murmur heard. Pulmonary/Chest: Effort normal and breath sounds normal. No stridor. No respiratory distress. He has no decreased breath sounds. He has no wheezes. He has no rhonchi. He has no rales.  Abdominal: Normal appearance. He exhibits distension and ascites. There is no tenderness. There is no rigidity, no rebound, no guarding and no CVA tenderness.  Musculoskeletal: Normal range of motion. He exhibits no edema or tenderness.  Neurological: He is alert and oriented to person, place, and time. He has normal strength. No cranial nerve deficit or sensory deficit. GCS eye subscore is 4. GCS verbal subscore is 5. GCS motor subscore is 6.  Skin: Skin is warm and dry. No abrasion and no rash noted.  Psychiatric: He has a normal mood and affect. His speech is normal and behavior is normal.  Nursing note and vitals reviewed.   ED Course  Procedures (including critical care time) Labs Review Labs Reviewed  CBC WITH DIFFERENTIAL/PLATELET  COMPREHENSIVE METABOLIC PANEL  LIPASE, BLOOD  ETHANOL  URINE RAPID DRUG SCREEN, HOSP PERFORMED  APTT  PROTIME-INR  TYPE AND SCREEN    Imaging Review No results found. I have personally reviewed and evaluated  these images and lab results as part of my medical decision-making.   EKG Interpretation None      MDM   Final diagnoses:  None    Patient tachycardic here and given IV fluids. Started on octreotide for suspected there so bleed. Discussed with Dr. Dulce Sellar, gastroenterologist on call, will see the patient in consultation. Patient  to be admitted to the stepdown unit    Lorre Nick, MD 06/21/15 1422

## 2015-06-21 NOTE — ED Notes (Signed)
Per EMS, pt from home, hx of alcoholism. Mother called d/t near syncopal episode after vomiting dark black emesis. Has also been having dark tarry stools, with two days of increasing abdominal distention. He is positive for positional respiratory distress when laying down. Hx of HTN with pressures in the 180s, BP in route 132/95, per mother this skin color is normal for him. Does not appear pale in triage, tachycardic in route at 130.  IV started by EMS in route, given 150 ml NS.

## 2015-06-21 NOTE — Progress Notes (Signed)
Pharmacy Antibiotic Follow-up Note  JERRIE GULLO is a 45 y.o. year-old male admitted on 06/21/2015.  The patient is currently on day 1 of Rocephin  for intra-abdominal infection.  Assessment/Plan: Rocephin 2gm x1, then continue 2gm q24 tomorrow  Temp (24hrs), Avg:98.1 F (36.7 C), Min:98.1 F (36.7 C), Max:98.1 F (36.7 C)   Recent Labs Lab 06/21/15 1208  WBC 10.7*    Recent Labs Lab 06/21/15 1200  CREATININE 1.02   CrCl cannot be calculated (Unknown ideal weight.).    No Known Allergies  Antimicrobials this admission: 2/21 Rocephin >>   Levels/dose changes this admission:  Microbiology results: no cx ordered  Thank you for allowing pharmacy to be a part of this patient's care.  Otho Bellows PharmD 06/21/2015 1:58 PM

## 2015-06-22 DIAGNOSIS — R221 Localized swelling, mass and lump, neck: Secondary | ICD-10-CM

## 2015-06-22 LAB — CBC
HCT: 28.8 % — ABNORMAL LOW (ref 39.0–52.0)
HCT: 29.6 % — ABNORMAL LOW (ref 39.0–52.0)
HEMOGLOBIN: 9.8 g/dL — AB (ref 13.0–17.0)
Hemoglobin: 10.1 g/dL — ABNORMAL LOW (ref 13.0–17.0)
MCH: 36.6 pg — ABNORMAL HIGH (ref 26.0–34.0)
MCH: 35.1 pg — ABNORMAL HIGH (ref 26.0–34.0)
MCHC: 35.1 g/dL (ref 30.0–36.0)
MCHC: 33.1 g/dL (ref 30.0–36.0)
MCV: 104.3 fL — ABNORMAL HIGH (ref 78.0–100.0)
MCV: 106.1 fL — ABNORMAL HIGH (ref 78.0–100.0)
PLATELETS: 99 10*3/uL — AB (ref 150–400)
Platelets: 93 10*3/uL — ABNORMAL LOW (ref 150–400)
RBC: 2.76 MIL/uL — AB (ref 4.22–5.81)
RBC: 2.79 MIL/uL — ABNORMAL LOW (ref 4.22–5.81)
RDW: 14.9 % (ref 11.5–15.5)
RDW: 15.2 % (ref 11.5–15.5)
WBC: 8.3 10*3/uL (ref 4.0–10.5)
WBC: 6.7 10*3/uL (ref 4.0–10.5)

## 2015-06-22 LAB — COMPREHENSIVE METABOLIC PANEL
ALT: 26 U/L (ref 17–63)
AST: 90 U/L — ABNORMAL HIGH (ref 15–41)
Albumin: 2.8 g/dL — ABNORMAL LOW (ref 3.5–5.0)
Alkaline Phosphatase: 77 U/L (ref 38–126)
Anion gap: 13 (ref 5–15)
BUN: 14 mg/dL (ref 6–20)
CHLORIDE: 105 mmol/L (ref 101–111)
CO2: 22 mmol/L (ref 22–32)
CREATININE: 0.72 mg/dL (ref 0.61–1.24)
Calcium: 6.9 mg/dL — ABNORMAL LOW (ref 8.9–10.3)
GFR calc non Af Amer: >60 mL/min (ref >60)
Glucose, Bld: 179 mg/dL — ABNORMAL HIGH (ref 65–99)
Potassium: 4.5 mmol/L (ref 3.5–5.1)
Sodium: 140 mmol/L (ref 135–145)
Total Bilirubin: 3 mg/dL — ABNORMAL HIGH (ref 0.3–1.2)
Total Protein: 6 g/dL — ABNORMAL LOW (ref 6.5–8.1)

## 2015-06-22 LAB — TROPONIN I

## 2015-06-22 MED ORDER — IPRATROPIUM BROMIDE 0.02 % IN SOLN: 0.5000 mg | Freq: Four times a day (QID) | RESPIRATORY_TRACT | Status: DC | PRN

## 2015-06-22 MED ORDER — DEXTROSE 5 % IV SOLN: 1.0000 g | INTRAVENOUS | Status: DC

## 2015-06-22 MED ORDER — LORAZEPAM 2 MG/ML IJ SOLN
1.0000 mg | Freq: Once | INTRAMUSCULAR | Status: AC
  Administered 2015-06-22: 1 mg via INTRAVENOUS
  Filled 2015-06-22: qty 1

## 2015-06-22 MED ORDER — PREDNISONE 20 MG PO TABS
40.0000 mg | ORAL_TABLET | Freq: Every day | ORAL | Status: DC
  Administered 2015-06-23 – 2015-06-24 (×2): 40 mg via ORAL
  Filled 2015-06-22 (×2): qty 2

## 2015-06-22 MED ORDER — MAGNESIUM SULFATE 2 GM/50ML IV SOLN
2.0000 g | Freq: Once | INTRAVENOUS | Status: AC
  Administered 2015-06-22: 2 g via INTRAVENOUS
  Filled 2015-06-22: qty 50

## 2015-06-22 NOTE — Progress Notes (Signed)
PROGRESS NOTE  Derrick WOON WUJ:[id number] DOB: 07-24-1970 DOA: 06/21/2015 PCP: No primary care provider on file.  45 year old male with a history of alcoholism, nicotine dependence, brought in via EMS because of a near-syncopal episode, after he had coffee-ground emesis that started 2-3 days ago. Patient has also noted melena for the last 1 week. He is also noticed increasing abdominal distention for the last several weeks. He also complains of exertional dyspnea and wheezing. He smokes about 1 pack a day. Patient was initially hypertensive with a blood pressure in the 180s, subsequently blood pressure was 132 /95. Patient found to be tachycardic in the 140s. Patient denies any recent weight loss. Patient has also noticed a lump along the right side of his neck which has been increasing in size for the last couple of years. Patient admits to drinking on a regular basis and mostly drinks beer and vodka.   Assessment/Plan:  Acute GI bleeding -presented with coffee-ground emesis, likely secondary to esophagitis versus portal hypertensive gastropathy in the setting of daily alcohol use -octreotide and Protonix drip -Serial CBC Gastroenterology, Dr. Dulce Sellar has been consulted  U/S shows: Cirrhosis as well as an indeterminate hypoechoic 3.7 cm focus in the central liver, cannot exclude a liver mass.  -serum AFP tumor marker ordered  Ascites likely secondary to alcoholic cirrhosis D/c rocephin Dr. Dulce Sellar recommends albumin administration followed by paracentesis (5.1L removed)  Alcohol abuse - CIWA protocol  Macrocytic anemia, thrombocytopenia Likely in the setting of alcohol use, avoid heparin products  Coagulopathy of the liver Suspect likely secondary to alcoholic cirrhosis of the liver Follow PT/INR  Acute COPD exacerbation -change to PO steroids, nebulizer treatments D/c abx  Lump in the neck - likely dermal inclusion cyst or other benign lesion. Location is readily amenable to  excisional biopsy-outpatient  SCDs for DVT prophylaxis  Code Status: full Family Communication: patient Disposition Plan:    Consultants:  GI  IR  Procedures:  paracentesis    HPI/Subjective: Does not plan to quit drinking but will cut back severely   Objective: Filed Vitals:   06/22/15 0500 06/22/15 0600  BP:  135/86  Pulse: 115 115  Temp:    Resp: 16 15    Intake/Output Summary (Last 24 hours) at 06/22/15 0802 Last data filed at 06/22/15 0600  Gross per 24 hour  Intake 2220.41 ml  Output      0 ml  Net 2220.41 ml   Filed Weights   06/21/15 2010  Weight: 75.6 kg (166 lb 10.7 oz)    Exam:   General:  Awake, tremulous  Cardiovascular: tachy  Respiratory: no wheezing  Abdomen: +BS, soft  Musculoskeletal: no edema   Data Reviewed: Basic Metabolic Panel:  Recent Labs Lab 06/21/15 1200 06/21/15 1300 06/22/15 0338  NA 136  --  140  K 3.8  --  4.5  CL 99*  --  105  CO2 23  --  22  GLUCOSE 198*  --  179*  BUN 23*  --  14  CREATININE 1.02  --  0.72  CALCIUM 7.8*  --  6.9*  MG  --  1.4*  --    Liver Function Tests:  Recent Labs Lab 06/21/15 1200 06/22/15 0338  AST 115* 90*  ALT 34 26  ALKPHOS 114 77  BILITOT 2.7* 3.0*  PROT 7.3 6.0*  ALBUMIN 2.9* 2.8*    Recent Labs Lab 06/21/15 1200  LIPASE 49   No results for input(s): AMMONIA in the last 168 hours. CBC:  Recent Labs Lab 06/21/15 1208 06/21/15 1820 06/21/15 2300 06/22/15 0338  WBC 10.7* 9.1 8.3 6.7  NEUTROABS 8.7*  --   --   --   HGB 12.0* 11.3* 10.1* 9.8*  HCT 36.5* 33.6* 28.8* 29.6*  MCV 105.8* 105.3* 104.3* 106.1*  PLT 175 127* 99* 93*   Cardiac Enzymes:  Recent Labs Lab 06/21/15 1300 06/21/15 2034 06/22/15 0338  TROPONINI <0.03 <0.03 <0.03   BNP (last 3 results) No results for input(s): BNP in the last 8760 hours.  ProBNP (last 3 results) No results for input(s): PROBNP in the last 8760 hours.  CBG: No results for input(s): GLUCAP in the last  168 hours.  Recent Results (from the past 240 hour(s))  Gram stain     Status: None   Collection Time: 06/21/15  3:49 PM  Result Value Ref Range Status   Specimen Description FLUID PERITONEAL  Final   Special Requests NONE  Final   Gram Stain   Final    MODERATE WBC PRESENT, PREDOMINANTLY MONONUCLEAR NO ORGANISMS SEEN Performed at [facility]    Report Status 06/21/2015 FINAL  Final  MRSA PCR Screening     Status: Abnormal   Collection Time: 06/21/15  8:13 PM  Result Value Ref Range Status   MRSA by PCR POSITIVE (A) NEGATIVE Final    Comment:        The GeneXpert MRSA Assay (FDA approved for NASAL specimens only), is one component of a comprehensive MRSA colonization surveillance program. It is not intended to diagnose MRSA infection nor to guide or monitor treatment for MRSA infections. RESULT CALLED TO, READ BACK BY AND VERIFIED WITH: Joaquin Bend [id number] @ 2127 BY J SCOTTON      Studies: Ct Soft Tissue Neck W Contrast  06/21/2015  CLINICAL DATA:  Vomiting and black emesis. Abdominal distention. Respiratory distress. Lump on right side of neck which has been increasing for a couple of years. EXAM: CT OF THE NECK WITH CONTRAST CT OF THE CHEST WITH CONTRAST CT OF THE ABDOMEN AND PELVIS WITH CONTRAST TECHNIQUE: Multidetector CT imaging of the neck was performed with intravenous contrast.; Multidetector CT imaging of the abdomen and pelvis was performed following the standard protocol during bolus administration of intravenous contrast.; Multidetector CT imaging of the chest was performed following the standard protocol during bolus administration of intravenous contrast. CONTRAST:  OMNIPAQUE IOHEXOL 300 MG/ML  SOLN COMPARISON:  None. FINDINGS: CT NECK FINDINGS The right neck palpable abnormality correlates with a ovoid immediately subcutaneous low-density mass with smooth, measuring 18 mm. This contacts the posterior margin of the sternocleidomastoid. No  superimposed inflammatory changes. Pharynx and larynx: No suspicious enhancement or other asymmetry. Salivary glands: Negative Thyroid: Negative Lymph nodes: No enlarged or necrotic appearing lymph nodes. Low-density rounded in tubular structures in the left supraclavicular fossa which are likely lymphatic. Vascular: Carotid bifurcation atherosclerosis without evidence of flow limiting stenosis. Limited intracranial: Negative Visualized orbits: Part visualization is negative Mastoids and visualized paranasal sinuses: Clear Skeleton: No acute or aggressive process CT CHEST FINDINGS THORACIC INLET/BODY WALL: No acute abnormality. MEDIASTINUM: Borderline cardiomegaly. No pericardial effusion. Atherosclerosis, extensive along the LAD, age advanced. No acute vascular abnormality. Diffuse mild thickening of the patulous esophagus without discrete varix. No adenopathy. LUNG WINDOWS: There is no edema, consolidation, effusion, or pneumothorax. Mild paraseptal emphysema at the apices. OSSEOUS: No acute fracture.  No suspicious lytic or blastic lesions. CT ABDOMEN PELVIS FINDINGS Hepatobiliary: Cirrhotic liver morphology with large fissures, liver capsule undulation, hypertrophied hepatic  artery, and ovegrown caudate. No masslike enhancement is seen. Patchy low density compatible with steatosis. No evidence of biliary obstruction or stone. Pancreas: Unremarkable. Spleen: Unremarkable.  No enlargement. Adrenals/Urinary Tract: Negative adrenals. 3 mm stone at the left ureteral vesicular junction with no hydronephrosis. Posterior hilar lip cyst on the left measuring 15 mm . Prominent bladder wall thickness without superimposed fat inflammation Reproductive:No pathologic findings. Stomach/Bowel: The bowel is diffusely thickened, from the stomach to the rectum, with submucosal low density edema. Mesenteric vessels are engorged. There could be small mural varices at the GE junction, but limited by oral contrast in the rugae. No  obstruction. No appendicitis. Vascular/Lymphatic: No acute vascular abnormality. Premature atherosclerosis. No portal venous occlusion. No mass or adenopathy. Peritoneal: Small ascites. 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 IMPRESSION: 1. Cirrhotic liver morphology with steatosis. 2. Gastric, small bowel, and colonic thickening with submucosal edema. Given diffuse appearance favor portal hypertension with hypoproteinemia over infectious/inflammatory process. 3. Non loculated ascites.  Recent paracentesis. 4. Nonobstructive 3 mm stone at the left ureteral vesicular junction. 5. The right neck palpable complaint correlates with a 18 mm subcutaneous cyst, likely dermal inclusion cyst or other benign lesion. Location is readily amenable to excisional biopsy. 6. No acute cardiopulmonary disease. Age advanced atherosclerosis, extensive in the coronary circulation. Electronically Signed   By: Marnee Spring M.D.   On: 06/21/2015 18:12   Ct Chest W Contrast  06/21/2015  CLINICAL DATA:  Vomiting and black emesis. Abdominal distention. Respiratory distress. Lump on right side of neck which has been increasing for a couple of years. EXAM: CT OF THE NECK WITH CONTRAST CT OF THE CHEST WITH CONTRAST CT OF THE ABDOMEN AND PELVIS WITH CONTRAST TECHNIQUE: Multidetector CT imaging of the neck was performed with intravenous contrast.; Multidetector CT imaging of the abdomen and pelvis was performed following the standard protocol during bolus administration of intravenous contrast.; Multidetector CT imaging of the chest was performed following the standard protocol during bolus administration of intravenous contrast. CONTRAST:  OMNIPAQUE IOHEXOL 300 MG/ML  SOLN COMPARISON:  None. FINDINGS: CT NECK FINDINGS The right neck palpable abnormality correlates with a ovoid immediately subcutaneous low-density mass with smooth, measuring 18 mm. This contacts the posterior margin of the  sternocleidomastoid. No superimposed inflammatory changes. Pharynx and larynx: No suspicious enhancement or other asymmetry. Salivary glands: Negative Thyroid: Negative Lymph nodes: No enlarged or necrotic appearing lymph nodes. Low-density rounded in tubular structures in the left supraclavicular fossa which are likely lymphatic. Vascular: Carotid bifurcation atherosclerosis without evidence of flow limiting stenosis. Limited intracranial: Negative Visualized orbits: Part visualization is negative Mastoids and visualized paranasal sinuses: Clear Skeleton: No acute or aggressive process CT CHEST FINDINGS THORACIC INLET/BODY WALL: No acute abnormality. MEDIASTINUM: Borderline cardiomegaly. No pericardial effusion. Atherosclerosis, extensive along the LAD, age advanced. No acute vascular abnormality. Diffuse mild thickening of the patulous esophagus without discrete varix. No adenopathy. LUNG WINDOWS: There is no edema, consolidation, effusion, or pneumothorax. Mild paraseptal emphysema at the apices. OSSEOUS: No acute fracture.  No suspicious lytic or blastic lesions. CT ABDOMEN PELVIS FINDINGS Hepatobiliary: Cirrhotic liver morphology with large fissures, liver capsule undulation, hypertrophied hepatic artery, and ovegrown caudate. No masslike enhancement is seen. Patchy low density compatible with steatosis. No evidence of biliary obstruction or stone. Pancreas: Unremarkable. Spleen: Unremarkable.  No enlargement. Adrenals/Urinary Tract: Negative adrenals. 3 mm stone at the left ureteral vesicular junction with no hydronephrosis. Posterior hilar lip cyst on the left measuring 15 mm . Prominent bladder wall thickness  without superimposed fat inflammation Reproductive:No pathologic findings. Stomach/Bowel: The bowel is diffusely thickened, from the stomach to the rectum, with submucosal low density edema. Mesenteric vessels are engorged. There could be small mural varices at the GE junction, but limited by oral  contrast in the rugae. No obstruction. No appendicitis. Vascular/Lymphatic: No acute vascular abnormality. Premature atherosclerosis. No portal venous occlusion. No mass or adenopathy. Peritoneal: Small ascites. 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 IMPRESSION: 1. Cirrhotic liver morphology with steatosis. 2. Gastric, small bowel, and colonic thickening with submucosal edema. Given diffuse appearance favor portal hypertension with hypoproteinemia over infectious/inflammatory process. 3. Non loculated ascites.  Recent paracentesis. 4. Nonobstructive 3 mm stone at the left ureteral vesicular junction. 5. The right neck palpable complaint correlates with a 18 mm subcutaneous cyst, likely dermal inclusion cyst or other benign lesion. Location is readily amenable to excisional biopsy. 6. No acute cardiopulmonary disease. Age advanced atherosclerosis, extensive in the coronary circulation. Electronically Signed   By: Marnee Spring M.D.   On: 06/21/2015 18:12   Ct Abdomen Pelvis W Contrast  06/21/2015  CLINICAL DATA:  Vomiting and black emesis. Abdominal distention. Respiratory distress. Lump on right side of neck which has been increasing for a couple of years. EXAM: CT OF THE NECK WITH CONTRAST CT OF THE CHEST WITH CONTRAST CT OF THE ABDOMEN AND PELVIS WITH CONTRAST TECHNIQUE: Multidetector CT imaging of the neck was performed with intravenous contrast.; Multidetector CT imaging of the abdomen and pelvis was performed following the standard protocol during bolus administration of intravenous contrast.; Multidetector CT imaging of the chest was performed following the standard protocol during bolus administration of intravenous contrast. CONTRAST:  OMNIPAQUE IOHEXOL 300 MG/ML  SOLN COMPARISON:  None. FINDINGS: CT NECK FINDINGS The right neck palpable abnormality correlates with a ovoid immediately subcutaneous low-density mass with smooth, measuring 18 mm. This contacts the  posterior margin of the sternocleidomastoid. No superimposed inflammatory changes. Pharynx and larynx: No suspicious enhancement or other asymmetry. Salivary glands: Negative Thyroid: Negative Lymph nodes: No enlarged or necrotic appearing lymph nodes. Low-density rounded in tubular structures in the left supraclavicular fossa which are likely lymphatic. Vascular: Carotid bifurcation atherosclerosis without evidence of flow limiting stenosis. Limited intracranial: Negative Visualized orbits: Part visualization is negative Mastoids and visualized paranasal sinuses: Clear Skeleton: No acute or aggressive process CT CHEST FINDINGS THORACIC INLET/BODY WALL: No acute abnormality. MEDIASTINUM: Borderline cardiomegaly. No pericardial effusion. Atherosclerosis, extensive along the LAD, age advanced. No acute vascular abnormality. Diffuse mild thickening of the patulous esophagus without discrete varix. No adenopathy. LUNG WINDOWS: There is no edema, consolidation, effusion, or pneumothorax. Mild paraseptal emphysema at the apices. OSSEOUS: No acute fracture.  No suspicious lytic or blastic lesions. CT ABDOMEN PELVIS FINDINGS Hepatobiliary: Cirrhotic liver morphology with large fissures, liver capsule undulation, hypertrophied hepatic artery, and ovegrown caudate. No masslike enhancement is seen. Patchy low density compatible with steatosis. No evidence of biliary obstruction or stone. Pancreas: Unremarkable. Spleen: Unremarkable.  No enlargement. Adrenals/Urinary Tract: Negative adrenals. 3 mm stone at the left ureteral vesicular junction with no hydronephrosis. Posterior hilar lip cyst on the left measuring 15 mm . Prominent bladder wall thickness without superimposed fat inflammation Reproductive:No pathologic findings. Stomach/Bowel: The bowel is diffusely thickened, from the stomach to the rectum, with submucosal low density edema. Mesenteric vessels are engorged. There could be small mural varices at the GE junction,  but limited by oral contrast in the rugae. No obstruction. No appendicitis. Vascular/Lymphatic: No acute vascular abnormality. Premature atherosclerosis. No  portal venous occlusion. No mass or adenopathy. Peritoneal: Small ascites. 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 IMPRESSION: 1. Cirrhotic liver morphology with steatosis. 2. Gastric, small bowel, and colonic thickening with submucosal edema. Given diffuse appearance favor portal hypertension with hypoproteinemia over infectious/inflammatory process. 3. Non loculated ascites.  Recent paracentesis. 4. Nonobstructive 3 mm stone at the left ureteral vesicular junction. 5. The right neck palpable complaint correlates with a 18 mm subcutaneous cyst, likely dermal inclusion cyst or other benign lesion. Location is readily amenable to excisional biopsy. 6. No acute cardiopulmonary disease. Age advanced atherosclerosis, extensive in the coronary circulation. Electronically Signed   By: Marnee Spring M.D.   On: 06/21/2015 18:12   US Paracentesis  06/21/2015  INDICATION: Patient with a near syncopal episode and possible coffee-ground emesis this morning. He is brought to the emergency apartment via EMS. He was noted to have abdominal distention with elevated liver function test and felt to likely have ascites. Paracentesis was requested. EXAM: ULTRASOUND GUIDED DIAGNOSTIC AND THERAPEUTIC PARACENTESIS MEDICATIONS: 1% lidocaine COMPLICATIONS: None immediate. PROCEDURE: Informed written consent was obtained from the patient after a discussion of the risks, benefits and alternatives to treatment. A timeout was performed prior to the initiation of the procedure. Initial ultrasound scanning demonstrates a large amount of ascites within the left lower abdominal quadrant. The left lower abdomen was prepped and draped in the usual sterile fashion. 1% lidocaine was used for local anesthesia. Following this, a Safe-T-Centesis catheter was  introduced. An ultrasound image was saved for documentation purposes. The paracentesis was performed. The catheter was removed and a dressing was applied. The patient tolerated the procedure well without immediate post procedural complication. FINDINGS: A total of approximately 5.1 L of clear yellow fluid was removed. Samples were sent to the laboratory as requested by the clinical team. IMPRESSION: Successful ultrasound-guided paracentesis yielding 5.1 liters of peritoneal fluid. Read by: Barnetta Chapel, PA-C Electronically Signed   By: Corlis Leak M.D.   On: 06/21/2015 16:23   US Abdomen Limited Ruq  06/21/2015  CLINICAL DATA:  Abdominal distention. Elevated liver function tests. Ascites. Post paracentesis earlier today. EXAM: US ABDOMEN LIMITED - RIGHT UPPER QUADRANT COMPARISON:  06/21/2015 CT abdomen/pelvis. FINDINGS: Gallbladder: Nondistended gallbladder. Moderate diffuse gallbladder wall thickening (6 mm in thickness). Layering sludge. No evidence of cholelithiasis. Pericholecystic fluid. No sonographic Murphy sign. Common bile duct: Diameter: 5 mm Liver: Liver surface is diffusely finely irregular and the liver parenchyma is diffusely coarsened in echotexture, in keeping with cirrhosis. There is a hypoechoic 3.4 x 3.7 x 3.4 cm focus measured by the technologist in the central liver, which is indeterminate, cannot exclude a liver mass in this location. The main portal vein is patent with appropriate flow direction. There is small to moderate perihepatic ascites. IMPRESSION: 1. Cirrhosis. 2. Indeterminate hypoechoic 3.7 cm focus in the central liver, cannot exclude a liver mass. Recommend correlation with serum AFP. A liver mass protocol MRI abdomen with and without IV contrast should be performed on a short term outpatient basis for further evaluation. If MRI is not feasible or contraindicated, a triphasic protocol CT of the abdomen with and without IV contrast is recommended. 3. Small to moderate volume  perihepatic ascites. 4. Nondistended thick-walled gallbladder with layering sludge. No cholelithiasis. No sonographic Murphy sign. The gallbladder wall thickening is likely reactive or due to non inflammatory edema (i.e. due to portal hypertension and/or hypoalbuminemia). 5. No biliary ductal dilatation. Electronically Signed   By: Jannifer Rodney.D.  On: 06/21/2015 18:06    Scheduled Meds: . cefTRIAXone (ROCEPHIN)  IV  2 g Intravenous Q24H  . Chlorhexidine Gluconate Cloth  6 each Topical Q0600  . folic acid  1 mg Oral Daily  . guaiFENesin  600 mg Oral BID  . methylPREDNISolone (SOLU-MEDROL) injection  40 mg Intravenous Q12H  . multivitamin with minerals  1 tablet Oral Daily  . mupirocin ointment  1 application Nasal BID  . nicotine  7 mg Transdermal Daily  . sodium chloride  250 mL Intravenous Once  . sodium chloride flush  3 mL Intravenous Q12H  . thiamine  100 mg Oral Daily   Or  . thiamine  100 mg Intravenous Daily   Continuous Infusions: . sodium chloride 125 mL/hr at 06/21/15 1159  . octreotide  (SANDOSTATIN)    IV infusion 50 mcg/hr (06/21/15 2208)  . pantoprozole (PROTONIX) infusion 8 mg/hr (06/22/15 0551)   Antibiotics Given (last 72 hours)    None      Principal Problem:   Acute GI bleeding Active Problems:   GI bleed   COPD with acute exacerbation (HCC)   Lump in neck   Ascites of liver    Time spent: 35 min    Nivan Melendrez U [facility]  [facility] Pager [phone]. If 7PM-7AM, please contact night-coverage at [email or web address], password [facility] 06/22/2015, 8:02 AM  LOS: 1 day

## 2015-06-22 NOTE — Progress Notes (Signed)
Subjective: Feels much better after paracentesis. No further hematemesis. Is hungry.  Objective: Vital signs in last 24 hours: Temp:  [97.8 F (36.6 C)-99.3 F (37.4 C)] 97.9 F (36.6 C) (02/22 0800) Pulse Rate:  [114-160] 114 (02/22 0800) Resp:  [15-30] 16 (02/22 0800) BP: (110-156)/(76-107) 153/96 mmHg (02/22 0800) SpO2:  [92 %-100 %] 96 % (02/22 0800) Weight:  [75.6 kg (166 lb 10.7 oz)] 75.6 kg (166 lb 10.7 oz) (02/21 2010) Weight change:  Last BM Date: 06/22/15  PE: GEN:  Older-appearing than stated age. NECK:  Right soft nodule neck ABD:  Less distended, non-tender  Lab Results: CBC    Component Value Date/Time   WBC 6.7 06/22/2015 0338   RBC 2.79* 06/22/2015 0338   HGB 9.8* 06/22/2015 0338   HCT 29.6* 06/22/2015 0338   PLT 93* 06/22/2015 0338   MCV 106.1* 06/22/2015 0338   MCH 35.1* 06/22/2015 0338   MCHC 33.1 06/22/2015 0338   RDW 15.2 06/22/2015 0338   LYMPHSABS 1.3 06/21/2015 1208   MONOABS 0.8 06/21/2015 1208   EOSABS 0.0 06/21/2015 1208   BASOSABS 0.0 06/21/2015 1208   CMP     Component Value Date/Time   NA 140 06/22/2015 0338   K 4.5 06/22/2015 0338   CL 105 06/22/2015 0338   CO2 22 06/22/2015 0338   GLUCOSE 179* 06/22/2015 0338   BUN 14 06/22/2015 0338   CREATININE 0.72 06/22/2015 0338   CALCIUM 6.9* 06/22/2015 0338   PROT 6.0* 06/22/2015 0338   ALBUMIN 2.8* 06/22/2015 0338   AST 90* 06/22/2015 0338   ALT 26 06/22/2015 0338   ALKPHOS 77 06/22/2015 0338   BILITOT 3.0* 06/22/2015 0338   GFRNONAA >60 06/22/2015 0338   GFRAA >60 06/22/2015 0338   Assessment:  1.  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 2.  Ascites, paracentesis SAAG > 1.1, most consistent with portal hypertension (cirrhosis-related).  No SBP. 3.  Bowel edema, suspect from hypoalbuminemia. 4.  Black emesis.  Resolved. 5.  Anemia. 6.  Alcohol abuse.  Plan:  1.  Clear liquid diet, advance to full liquids today as tolerated. 2.  Continue PPI and octreotide today. 3.   Endoscopy tomorrow for further investigation of dark stool and black emesis. 4.  Alcohol abstinence discussed at length with patient. 5.  [facility] will follow.   Derrick [name] 06/22/2015, 9:38 AM   Pager [phone] If no answer or after 5 PM call [phone]

## 2015-06-22 NOTE — Progress Notes (Signed)
Pharmacy Antibiotic Follow-up Note  Derrick [name] is a 45 y.o. year-old male admitted on 06/21/2015.  The patient is currently on day 1 of Rocephin  for intra-abdominal infection.  Today, 06/22/2015: Temp: afebrile (max 99.3) WBC: improved to wnl Renal: CrCl wnl  Assessment/Plan:  Per GI, doesn't appear to be SBP.  Will reduce ceftriaxone to 1g IV q24 hr for prophylaxis of SBP.  No further adjustments anticipated; will sign off.  Please reconsult if clinical status changes warrant pharmacy intervention  Temp (24hrs), Avg:98.5 F (36.9 C), Min:97.9 F (36.6 C), Max:99.3 F (37.4 C)   Recent Labs Lab 06/21/15 1208 06/21/15 1820 06/21/15 2300 06/22/15 0338  WBC 10.7* 9.1 8.3 6.7     Recent Labs Lab 06/21/15 1200 06/22/15 0338  CREATININE 1.02 0.72   Estimated Creatinine Clearance: 116.4 mL/min (by C-G formula based on Cr of 0.72).    No Known Allergies   Antimicrobials this admission: 2/21 Rocephin >>  Dose adjustments this admission: 2/21: reduced to 1 q 24 for ppx dosing  Microbiology results: 2/21 Peritoneal fluid: ngtd; no orgs seen on Gm stain - AFB sent MRSA nasal swab: POSITIVE  Thank you for allowing pharmacy to be a part of this patient's care.  Bernadene Person, PharmD, BCPS Pager: [phone] 06/22/2015, 3:05 PM

## 2015-06-22 NOTE — Care Management Note (Signed)
Case Management Note  Patient Details  Name: EARLE TROIANO MRN: [id number] Date of Birth: [birth year]  Subjective/Objective:             Gi bleed, ascites       Action/Plan:Date: June 22, 2015 Chart reviewed for concurrent status and case management needs. Will continue to follow patient for changes and needs: Marcelle Smiling, BSN, RN, [place]   [phone]   Expected Discharge Date:   (unknown)               Expected Discharge Plan:  Home/Self Care  In-House Referral:  NA  Discharge planning Services  CM Consult  Post Acute Care Choice:  NA Choice offered to:  NA  DME Arranged:  N/A DME Agency:  NA  HH Arranged:  NA HH Agency:  NA  Status of Service:  Completed, signed off  Medicare Important Message Given:    Date Medicare IM Given:    Medicare IM give by:    Date Additional Medicare IM Given:    Additional Medicare Important Message give by:     If discussed at Long Length of Stay Meetings, dates discussed:    Additional Comments:  Golda Acre, RN 06/22/2015, 10:41 AM

## 2015-06-23 ENCOUNTER — Encounter (HOSPITAL_COMMUNITY)
Admission: EM | Disposition: A | Discharge status: Home / Self Care | Payer: Self-pay | Source: Home / Self Care | Attending: Internal Medicine

## 2015-06-23 ENCOUNTER — Inpatient Hospital Stay (HOSPITAL_COMMUNITY): Payer: MEDICAID | Admitting: Anesthesiology

## 2015-06-23 ENCOUNTER — Encounter (HOSPITAL_COMMUNITY): Payer: Self-pay

## 2015-06-23 HISTORY — PX: ESOPHAGOGASTRODUODENOSCOPY (EGD) WITH PROPOFOL: SHX5813

## 2015-06-23 LAB — CBC
HEMATOCRIT: 29.2 % — AB (ref 39.0–52.0)
HEMOGLOBIN: 9.8 g/dL — AB (ref 13.0–17.0)
MCH: 35.6 pg — AB (ref 26.0–34.0)
MCHC: 33.6 g/dL (ref 30.0–36.0)
MCV: 106.2 fL — AB (ref 78.0–100.0)
Platelets: 71 10*3/uL — ABNORMAL LOW (ref 150–400)
RBC: 2.75 MIL/uL — ABNORMAL LOW (ref 4.22–5.81)
RDW: 14.9 % (ref 11.5–15.5)
WBC: 4 10*3/uL (ref 4.0–10.5)

## 2015-06-23 LAB — AFP TUMOR MARKER: AFP-Tumor Marker: 3.2 ng/mL (ref 0.0–8.3)

## 2015-06-23 LAB — BASIC METABOLIC PANEL
Anion gap: 10 (ref 5–15)
BUN: 14 mg/dL (ref 6–20)
CALCIUM: 6.7 mg/dL — AB (ref 8.9–10.3)
CHLORIDE: 103 mmol/L (ref 101–111)
CO2: 25 mmol/L (ref 22–32)
CREATININE: 0.65 mg/dL (ref 0.61–1.24)
GFR calc non Af Amer: >60 mL/min (ref >60)
GLUCOSE: 236 mg/dL — AB (ref 65–99)
Potassium: 3.9 mmol/L (ref 3.5–5.1)
Sodium: 138 mmol/L (ref 135–145)

## 2015-06-23 LAB — GLUCOSE, CAPILLARY
GLUCOSE-CAPILLARY: 188 mg/dL — AB (ref 65–99)
GLUCOSE-CAPILLARY: 262 mg/dL — AB (ref 65–99)
GLUCOSE-CAPILLARY: 137 mg/dL — AB (ref 65–99)

## 2015-06-23 SURGERY — ESOPHAGOGASTRODUODENOSCOPY (EGD) WITH PROPOFOL
Anesthesia: Monitor Anesthesia Care

## 2015-06-23 MED ORDER — PROPOFOL 10 MG/ML IV BOLUS
INTRAVENOUS | Status: DC | PRN
  Administered 2015-06-23: 40 mg via INTRAVENOUS
  Administered 2015-06-23: 20 mg via INTRAVENOUS
  Administered 2015-06-23: 40 mg via INTRAVENOUS

## 2015-06-23 MED ORDER — PROPRANOLOL HCL 10 MG PO TABS
10.0000 mg | ORAL_TABLET | Freq: Two times a day (BID) | ORAL | Status: DC
  Administered 2015-06-23 – 2015-06-24 (×3): 10 mg via ORAL
  Filled 2015-06-23 (×6): qty 1

## 2015-06-23 MED ORDER — FUROSEMIDE 40 MG PO TABS
40.0000 mg | ORAL_TABLET | Freq: Every day | ORAL | Status: DC
  Administered 2015-06-23 – 2015-06-24 (×2): 40 mg via ORAL
  Filled 2015-06-23 (×2): qty 1

## 2015-06-23 MED ORDER — SPIRONOLACTONE 25 MG PO TABS
100.0000 mg | ORAL_TABLET | Freq: Every day | ORAL | Status: DC
  Administered 2015-06-23 – 2015-06-24 (×2): 100 mg via ORAL
  Filled 2015-06-23 (×2): qty 4

## 2015-06-23 MED ORDER — PROPOFOL 500 MG/50ML IV EMUL
INTRAVENOUS | Status: DC | PRN
  Administered 2015-06-23: 125 ug/kg/min via INTRAVENOUS

## 2015-06-23 MED ORDER — CIPROFLOXACIN HCL 500 MG PO TABS
500.0000 mg | ORAL_TABLET | Freq: Two times a day (BID) | ORAL | Status: DC
  Administered 2015-06-23 – 2015-06-24 (×3): 500 mg via ORAL
  Filled 2015-06-23 (×6): qty 1

## 2015-06-23 MED ORDER — INSULIN ASPART 100 UNIT/ML ~~LOC~~ SOLN
0.0000 [IU] | Freq: Three times a day (TID) | SUBCUTANEOUS | Status: DC
  Administered 2015-06-23: 5 [IU] via SUBCUTANEOUS
  Administered 2015-06-24: 1 [IU] via SUBCUTANEOUS

## 2015-06-23 MED ORDER — PANTOPRAZOLE SODIUM 40 MG PO TBEC
40.0000 mg | DELAYED_RELEASE_TABLET | Freq: Every day | ORAL | Status: DC
  Administered 2015-06-23 – 2015-06-24 (×2): 40 mg via ORAL
  Filled 2015-06-23 (×3): qty 1

## 2015-06-23 MED ORDER — LACTATED RINGERS IV SOLN
INTRAVENOUS | Status: DC | PRN
  Administered 2015-06-23: 09:00:00 via INTRAVENOUS

## 2015-06-23 MED ORDER — PROPOFOL 10 MG/ML IV BOLUS
INTRAVENOUS | Status: AC
  Filled 2015-06-23: qty 60

## 2015-06-23 MED ORDER — HYDRALAZINE HCL 20 MG/ML IJ SOLN
10.0000 mg | Freq: Four times a day (QID) | INTRAMUSCULAR | Status: DC | PRN
  Administered 2015-06-23: 10 mg via INTRAVENOUS
  Filled 2015-06-23: qty 1

## 2015-06-23 MED ORDER — PROPOFOL 10 MG/ML IV BOLUS
INTRAVENOUS | Status: AC
  Filled 2015-06-23: qty 20

## 2015-06-23 SURGICAL SUPPLY — 14 items

## 2015-06-23 NOTE — Progress Notes (Signed)
Pt arrived to unit in w/c. Stood ambulated to bed w/ CG assist. Oriented to callbell and environment. POC discussed. VSS. Denies pain/discomfort at present. Contact precautions maintained.  Tele box 31 confirmed x 2 with CCMD.

## 2015-06-23 NOTE — Progress Notes (Signed)
PROGRESS NOTE  Derrick [name] AVW:[id number] DOB: Mar 13, 1971 DOA: 06/21/2015 PCP: No primary care provider on file.  45 year old male with a history of alcoholism, nicotine dependence, brought in via EMS because of a near-syncopal episode, after he had coffee-ground emesis that started 2-3 days ago. Patient has also noted melena for the last 1 week. He is also noticed increasing abdominal distention for the last several weeks. He also complains of exertional dyspnea and wheezing. He smokes about 1 pack a day. Patient was initially hypertensive with a blood pressure in the 180s, subsequently blood pressure was 132 /95. Patient found to be tachycardic in the 140s. Patient denies any recent weight loss. Patient has also noticed a lump along the right side of his neck which has been increasing in size for the last couple of years. Patient admits to drinking on a regular basis and mostly drinks beer and vodka.   Assessment/Plan:  Acute GI bleeding -presented with coffee-ground emesis, likely secondary to esophagitis versus portal hypertensive gastropathy in the setting of daily alcohol use -octreotide and Protonix drip- d/c'd -Serial CBC Gastroenterology, Dr. Dulce Sellar has been consulted  U/S shows: Cirrhosis as well as an indeterminate hypoechoic 3.7 cm focus in the central liver, cannot exclude a liver mass.  -serum AFP tumor marker negative -cipro for SBP prophylaxis watch Qtc -propanolol  Ascites likely secondary to alcoholic cirrhosis D/c rocephin Dr. Dulce Sellar recommends albumin administration followed by paracentesis (5.1L removed) -lasix/aldactone  Alcohol abuse - CIWA protocol  HTN -PRns Lasix/aldactone  Macrocytic anemia, thrombocytopenia Likely in the setting of alcohol use, avoid heparin products  Coagulopathy of the liver Suspect likely secondary to alcoholic cirrhosis of the liver  Acute COPD exacerbation -change to PO steroids, nebulizer treatments D/c  abx  Hyperglycemia -SSI -check HgbA1C  Lump in the neck - likely dermal inclusion cyst or other benign lesion. Location is readily amenable to excisional biopsy-outpatient  SCDs for DVT prophylaxis  Care management for follow up   Code Status: full Family Communication: patient Disposition Plan: home in AM   Consultants:  GI  IR  Procedures:  paracentesis    HPI/Subjective: Says his blood sugars have been "boarderline" in past Has put out 3L urine since EGD   Objective: Filed Vitals:   06/23/15 1144 06/23/15 1200  BP:  181/105  Pulse:  81  Temp: 98.1 F (36.7 C)   Resp:  11    Intake/Output Summary (Last 24 hours) at 06/23/15 1425 Last data filed at 06/23/15 1200  Gross per 24 hour  Intake 4963.33 ml  Output   3200 ml  Net 1763.33 ml   Filed Weights   06/21/15 2010 06/23/15 0825  Weight: 75.6 kg (166 lb 10.7 oz) 75.297 kg (166 lb)    Exam:   General:  Awake, tremulous  Cardiovascular: tachy  Respiratory: no wheezing  Abdomen: +BS, soft  Musculoskeletal: no edema   Data Reviewed: Basic Metabolic Panel:  Recent Labs Lab 06/21/15 1200 06/21/15 1300 06/22/15 0338 06/23/15 0326  NA 136  --  140 138  K 3.8  --  4.5 3.9  CL 99*  --  105 103  CO2 23  --  22 25  GLUCOSE 198*  --  179* 236*  BUN 23*  --  14 14  CREATININE 1.02  --  0.72 0.65  CALCIUM 7.8*  --  6.9* 6.7*  MG  --  1.4*  --   --    Liver Function Tests:  Recent Labs Lab 06/21/15 1200 06/22/15  0338  AST 115* 90*  ALT 34 26  ALKPHOS 114 77  BILITOT 2.7* 3.0*  PROT 7.3 6.0*  ALBUMIN 2.9* 2.8*    Recent Labs Lab 06/21/15 1200  LIPASE 49   No results for input(s): AMMONIA in the last 168 hours. CBC:  Recent Labs Lab 06/21/15 1208 06/21/15 1820 06/21/15 2300 06/22/15 0338 06/23/15 0326  WBC 10.7* 9.1 8.3 6.7 4.0  NEUTROABS 8.7*  --   --   --   --   HGB 12.0* 11.3* 10.1* 9.8* 9.8*  HCT 36.5* 33.6* 28.8* 29.6* 29.2*  MCV 105.8* 105.3* 104.3* 106.1*  106.2*  PLT 175 127* 99* 93* 71*   Cardiac Enzymes:  Recent Labs Lab 06/21/15 1300 06/21/15 2034 06/22/15 0338  TROPONINI <0.03 <0.03 <0.03   BNP (last 3 results) No results for input(s): BNP in the last 8760 hours.  ProBNP (last 3 results) No results for input(s): PROBNP in the last 8760 hours.  CBG: No results for input(s): GLUCAP in the last 168 hours.  Recent Results (from the past 240 hour(s))  Culture, body fluid-bottle     Status: None (Preliminary result)   Collection Time: 06/21/15  3:49 PM  Result Value Ref Range Status   Specimen Description FLUID PERITONEAL  Final   Special Requests BOTTLES DRAWN AEROBIC AND ANAEROBIC 10CC  Final   Culture   Final    NO GROWTH 2 DAYS Performed at [facility]    Report Status PENDING  Incomplete  Gram stain     Status: None   Collection Time: 06/21/15  3:49 PM  Result Value Ref Range Status   Specimen Description FLUID PERITONEAL  Final   Special Requests NONE  Final   Gram Stain   Final    MODERATE WBC PRESENT, PREDOMINANTLY MONONUCLEAR NO ORGANISMS SEEN Performed at [facility]    Report Status 06/21/2015 FINAL  Final  MRSA PCR Screening     Status: Abnormal   Collection Time: 06/21/15  8:13 PM  Result Value Ref Range Status   MRSA by PCR POSITIVE (A) NEGATIVE Final    Comment:        The GeneXpert MRSA Assay (FDA approved for NASAL specimens only), is one component of a comprehensive MRSA colonization surveillance program. It is not intended to diagnose MRSA infection nor to guide or monitor treatment for MRSA infections. RESULT CALLED TO, READ BACK BY AND VERIFIED WITH: Joaquin Bend [id number] @ 2127 BY J SCOTTON      Studies: Ct Soft Tissue Neck W Contrast  06/21/2015  CLINICAL DATA:  Vomiting and black emesis. Abdominal distention. Respiratory distress. Lump on right side of neck which has been increasing for a couple of years. EXAM: CT OF THE NECK WITH CONTRAST CT OF THE CHEST WITH  CONTRAST CT OF THE ABDOMEN AND PELVIS WITH CONTRAST TECHNIQUE: Multidetector CT imaging of the neck was performed with intravenous contrast.; Multidetector CT imaging of the abdomen and pelvis was performed following the standard protocol during bolus administration of intravenous contrast.; Multidetector CT imaging of the chest was performed following the standard protocol during bolus administration of intravenous contrast. CONTRAST:  OMNIPAQUE IOHEXOL 300 MG/ML  SOLN COMPARISON:  None. FINDINGS: CT NECK FINDINGS The right neck palpable abnormality correlates with a ovoid immediately subcutaneous low-density mass with smooth, measuring 18 mm. This contacts the posterior margin of the sternocleidomastoid. No superimposed inflammatory changes. Pharynx and larynx: No suspicious enhancement or other asymmetry. Salivary glands: Negative Thyroid: Negative Lymph nodes: No  enlarged or necrotic appearing lymph nodes. Low-density rounded in tubular structures in the left supraclavicular fossa which are likely lymphatic. Vascular: Carotid bifurcation atherosclerosis without evidence of flow limiting stenosis. Limited intracranial: Negative Visualized orbits: Part visualization is negative Mastoids and visualized paranasal sinuses: Clear Skeleton: No acute or aggressive process CT CHEST FINDINGS THORACIC INLET/BODY WALL: No acute abnormality. MEDIASTINUM: Borderline cardiomegaly. No pericardial effusion. Atherosclerosis, extensive along the LAD, age advanced. No acute vascular abnormality. Diffuse mild thickening of the patulous esophagus without discrete varix. No adenopathy. LUNG WINDOWS: There is no edema, consolidation, effusion, or pneumothorax. Mild paraseptal emphysema at the apices. OSSEOUS: No acute fracture.  No suspicious lytic or blastic lesions. CT ABDOMEN PELVIS FINDINGS Hepatobiliary: Cirrhotic liver morphology with large fissures, liver capsule undulation, hypertrophied hepatic artery, and ovegrown  caudate. No masslike enhancement is seen. Patchy low density compatible with steatosis. No evidence of biliary obstruction or stone. Pancreas: Unremarkable. Spleen: Unremarkable.  No enlargement. Adrenals/Urinary Tract: Negative adrenals. 3 mm stone at the left ureteral vesicular junction with no hydronephrosis. Posterior hilar lip cyst on the left measuring 15 mm . Prominent bladder wall thickness without superimposed fat inflammation Reproductive:No pathologic findings. Stomach/Bowel: The bowel is diffusely thickened, from the stomach to the rectum, with submucosal low density edema. Mesenteric vessels are engorged. There could be small mural varices at the GE junction, but limited by oral contrast in the rugae. No obstruction. No appendicitis. Vascular/Lymphatic: No acute vascular abnormality. Premature atherosclerosis. No portal venous occlusion. No mass or adenopathy. Peritoneal: Small ascites. 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 IMPRESSION: 1. Cirrhotic liver morphology with steatosis. 2. Gastric, small bowel, and colonic thickening with submucosal edema. Given diffuse appearance favor portal hypertension with hypoproteinemia over infectious/inflammatory process. 3. Non loculated ascites.  Recent paracentesis. 4. Nonobstructive 3 mm stone at the left ureteral vesicular junction. 5. The right neck palpable complaint correlates with a 18 mm subcutaneous cyst, likely dermal inclusion cyst or other benign lesion. Location is readily amenable to excisional biopsy. 6. No acute cardiopulmonary disease. Age advanced atherosclerosis, extensive in the coronary circulation. Electronically Signed   By: Marnee Spring M.D.   On: 06/21/2015 18:12   Ct Chest W Contrast  06/21/2015  CLINICAL DATA:  Vomiting and black emesis. Abdominal distention. Respiratory distress. Lump on right side of neck which has been increasing for a couple of years. EXAM: CT OF THE NECK WITH CONTRAST CT OF  THE CHEST WITH CONTRAST CT OF THE ABDOMEN AND PELVIS WITH CONTRAST TECHNIQUE: Multidetector CT imaging of the neck was performed with intravenous contrast.; Multidetector CT imaging of the abdomen and pelvis was performed following the standard protocol during bolus administration of intravenous contrast.; Multidetector CT imaging of the chest was performed following the standard protocol during bolus administration of intravenous contrast. CONTRAST:  OMNIPAQUE IOHEXOL 300 MG/ML  SOLN COMPARISON:  None. FINDINGS: CT NECK FINDINGS The right neck palpable abnormality correlates with a ovoid immediately subcutaneous low-density mass with smooth, measuring 18 mm. This contacts the posterior margin of the sternocleidomastoid. No superimposed inflammatory changes. Pharynx and larynx: No suspicious enhancement or other asymmetry. Salivary glands: Negative Thyroid: Negative Lymph nodes: No enlarged or necrotic appearing lymph nodes. Low-density rounded in tubular structures in the left supraclavicular fossa which are likely lymphatic. Vascular: Carotid bifurcation atherosclerosis without evidence of flow limiting stenosis. Limited intracranial: Negative Visualized orbits: Part visualization is negative Mastoids and visualized paranasal sinuses: Clear Skeleton: No acute or aggressive process CT CHEST FINDINGS THORACIC INLET/BODY WALL: No acute abnormality.  MEDIASTINUM: Borderline cardiomegaly. No pericardial effusion. Atherosclerosis, extensive along the LAD, age advanced. No acute vascular abnormality. Diffuse mild thickening of the patulous esophagus without discrete varix. No adenopathy. LUNG WINDOWS: There is no edema, consolidation, effusion, or pneumothorax. Mild paraseptal emphysema at the apices. OSSEOUS: No acute fracture.  No suspicious lytic or blastic lesions. CT ABDOMEN PELVIS FINDINGS Hepatobiliary: Cirrhotic liver morphology with large fissures, liver capsule undulation, hypertrophied hepatic artery, and  ovegrown caudate. No masslike enhancement is seen. Patchy low density compatible with steatosis. No evidence of biliary obstruction or stone. Pancreas: Unremarkable. Spleen: Unremarkable.  No enlargement. Adrenals/Urinary Tract: Negative adrenals. 3 mm stone at the left ureteral vesicular junction with no hydronephrosis. Posterior hilar lip cyst on the left measuring 15 mm . Prominent bladder wall thickness without superimposed fat inflammation Reproductive:No pathologic findings. Stomach/Bowel: The bowel is diffusely thickened, from the stomach to the rectum, with submucosal low density edema. Mesenteric vessels are engorged. There could be small mural varices at the GE junction, but limited by oral contrast in the rugae. No obstruction. No appendicitis. Vascular/Lymphatic: No acute vascular abnormality. Premature atherosclerosis. No portal venous occlusion. No mass or adenopathy. Peritoneal: Small ascites. 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 IMPRESSION: 1. Cirrhotic liver morphology with steatosis. 2. Gastric, small bowel, and colonic thickening with submucosal edema. Given diffuse appearance favor portal hypertension with hypoproteinemia over infectious/inflammatory process. 3. Non loculated ascites.  Recent paracentesis. 4. Nonobstructive 3 mm stone at the left ureteral vesicular junction. 5. The right neck palpable complaint correlates with a 18 mm subcutaneous cyst, likely dermal inclusion cyst or other benign lesion. Location is readily amenable to excisional biopsy. 6. No acute cardiopulmonary disease. Age advanced atherosclerosis, extensive in the coronary circulation. Electronically Signed   By: Marnee Spring M.D.   On: 06/21/2015 18:12   Ct Abdomen Pelvis W Contrast  06/21/2015  CLINICAL DATA:  Vomiting and black emesis. Abdominal distention. Respiratory distress. Lump on right side of neck which has been increasing for a couple of years. EXAM: CT OF THE NECK  WITH CONTRAST CT OF THE CHEST WITH CONTRAST CT OF THE ABDOMEN AND PELVIS WITH CONTRAST TECHNIQUE: Multidetector CT imaging of the neck was performed with intravenous contrast.; Multidetector CT imaging of the abdomen and pelvis was performed following the standard protocol during bolus administration of intravenous contrast.; Multidetector CT imaging of the chest was performed following the standard protocol during bolus administration of intravenous contrast. CONTRAST:  OMNIPAQUE IOHEXOL 300 MG/ML  SOLN COMPARISON:  None. FINDINGS: CT NECK FINDINGS The right neck palpable abnormality correlates with a ovoid immediately subcutaneous low-density mass with smooth, measuring 18 mm. This contacts the posterior margin of the sternocleidomastoid. No superimposed inflammatory changes. Pharynx and larynx: No suspicious enhancement or other asymmetry. Salivary glands: Negative Thyroid: Negative Lymph nodes: No enlarged or necrotic appearing lymph nodes. Low-density rounded in tubular structures in the left supraclavicular fossa which are likely lymphatic. Vascular: Carotid bifurcation atherosclerosis without evidence of flow limiting stenosis. Limited intracranial: Negative Visualized orbits: Part visualization is negative Mastoids and visualized paranasal sinuses: Clear Skeleton: No acute or aggressive process CT CHEST FINDINGS THORACIC INLET/BODY WALL: No acute abnormality. MEDIASTINUM: Borderline cardiomegaly. No pericardial effusion. Atherosclerosis, extensive along the LAD, age advanced. No acute vascular abnormality. Diffuse mild thickening of the patulous esophagus without discrete varix. No adenopathy. LUNG WINDOWS: There is no edema, consolidation, effusion, or pneumothorax. Mild paraseptal emphysema at the apices. OSSEOUS: No acute fracture.  No suspicious lytic or blastic lesions. CT ABDOMEN PELVIS  FINDINGS Hepatobiliary: Cirrhotic liver morphology with large fissures, liver capsule undulation, hypertrophied  hepatic artery, and ovegrown caudate. No masslike enhancement is seen. Patchy low density compatible with steatosis. No evidence of biliary obstruction or stone. Pancreas: Unremarkable. Spleen: Unremarkable.  No enlargement. Adrenals/Urinary Tract: Negative adrenals. 3 mm stone at the left ureteral vesicular junction with no hydronephrosis. Posterior hilar lip cyst on the left measuring 15 mm . Prominent bladder wall thickness without superimposed fat inflammation Reproductive:No pathologic findings. Stomach/Bowel: The bowel is diffusely thickened, from the stomach to the rectum, with submucosal low density edema. Mesenteric vessels are engorged. There could be small mural varices at the GE junction, but limited by oral contrast in the rugae. No obstruction. No appendicitis. Vascular/Lymphatic: No acute vascular abnormality. Premature atherosclerosis. No portal venous occlusion. No mass or adenopathy. Peritoneal: Small ascites. 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 IMPRESSION: 1. Cirrhotic liver morphology with steatosis. 2. Gastric, small bowel, and colonic thickening with submucosal edema. Given diffuse appearance favor portal hypertension with hypoproteinemia over infectious/inflammatory process. 3. Non loculated ascites.  Recent paracentesis. 4. Nonobstructive 3 mm stone at the left ureteral vesicular junction. 5. The right neck palpable complaint correlates with a 18 mm subcutaneous cyst, likely dermal inclusion cyst or other benign lesion. Location is readily amenable to excisional biopsy. 6. No acute cardiopulmonary disease. Age advanced atherosclerosis, extensive in the coronary circulation. Electronically Signed   By: Marnee Spring M.D.   On: 06/21/2015 18:12   US Paracentesis  06/21/2015  INDICATION: Patient with a near syncopal episode and possible coffee-ground emesis this morning. He is brought to the emergency apartment via EMS. He was noted to have abdominal  distention with elevated liver function test and felt to likely have ascites. Paracentesis was requested. EXAM: ULTRASOUND GUIDED DIAGNOSTIC AND THERAPEUTIC PARACENTESIS MEDICATIONS: 1% lidocaine COMPLICATIONS: None immediate. PROCEDURE: Informed written consent was obtained from the patient after a discussion of the risks, benefits and alternatives to treatment. A timeout was performed prior to the initiation of the procedure. Initial ultrasound scanning demonstrates a large amount of ascites within the left lower abdominal quadrant. The left lower abdomen was prepped and draped in the usual sterile fashion. 1% lidocaine was used for local anesthesia. Following this, a Safe-T-Centesis catheter was introduced. An ultrasound image was saved for documentation purposes. The paracentesis was performed. The catheter was removed and a dressing was applied. The patient tolerated the procedure well without immediate post procedural complication. FINDINGS: A total of approximately 5.1 L of clear yellow fluid was removed. Samples were sent to the laboratory as requested by the clinical team. IMPRESSION: Successful ultrasound-guided paracentesis yielding 5.1 liters of peritoneal fluid. Read by: Barnetta Chapel, PA-C Electronically Signed   By: Corlis Leak M.D.   On: 06/21/2015 16:23   US Abdomen Limited Ruq  06/21/2015  CLINICAL DATA:  Abdominal distention. Elevated liver function tests. Ascites. Post paracentesis earlier today. EXAM: US ABDOMEN LIMITED - RIGHT UPPER QUADRANT COMPARISON:  06/21/2015 CT abdomen/pelvis. FINDINGS: Gallbladder: Nondistended gallbladder. Moderate diffuse gallbladder wall thickening (6 mm in thickness). Layering sludge. No evidence of cholelithiasis. Pericholecystic fluid. No sonographic Murphy sign. Common bile duct: Diameter: 5 mm Liver: Liver surface is diffusely finely irregular and the liver parenchyma is diffusely coarsened in echotexture, in keeping with cirrhosis. There is a hypoechoic 3.4 x  3.7 x 3.4 cm focus measured by the technologist in the central liver, which is indeterminate, cannot exclude a liver mass in this location. The main portal vein is patent with appropriate  flow direction. There is small to moderate perihepatic ascites. IMPRESSION: 1. Cirrhosis. 2. Indeterminate hypoechoic 3.7 cm focus in the central liver, cannot exclude a liver mass. Recommend correlation with serum AFP. A liver mass protocol MRI abdomen with and without IV contrast should be performed on a short term outpatient basis for further evaluation. If MRI is not feasible or contraindicated, a triphasic protocol CT of the abdomen with and without IV contrast is recommended. 3. Small to moderate volume perihepatic ascites. 4. Nondistended thick-walled gallbladder with layering sludge. No cholelithiasis. No sonographic Murphy sign. The gallbladder wall thickening is likely reactive or due to non inflammatory edema (i.e. due to portal hypertension and/or hypoalbuminemia). 5. No biliary ductal dilatation. Electronically Signed   By: Delbert Phenix M.D.   On: 06/21/2015 18:06    Scheduled Meds: . Chlorhexidine Gluconate Cloth  6 each Topical Q0600  . ciprofloxacin  500 mg Oral BID  . folic acid  1 mg Oral Daily  . furosemide  40 mg Oral Daily  . guaiFENesin  600 mg Oral BID  . insulin aspart  0-9 Units Subcutaneous TID WC  . multivitamin with minerals  1 tablet Oral Daily  . mupirocin ointment  1 application Nasal BID  . nicotine  7 mg Transdermal Daily  . pantoprazole  40 mg Oral Daily  . predniSONE  40 mg Oral Q breakfast  . propranolol  10 mg Oral BID  . sodium chloride  250 mL Intravenous Once  . sodium chloride flush  3 mL Intravenous Q12H  . spironolactone  100 mg Oral Daily  . thiamine  100 mg Oral Daily   Or  . thiamine  100 mg Intravenous Daily   Continuous Infusions:   Antibiotics Given (last 72 hours)    Date/Time Action Medication Dose   06/23/15 1044 Given  [patient in endo]    ciprofloxacin (CIPRO) tablet 500 mg 500 mg      Principal Problem:   Acute GI bleeding Active Problems:   GI bleed   COPD with acute exacerbation (HCC)   Lump in neck   Ascites of liver    Time spent: 35 min    Hanad Leino [facility]  [facility] Pager [phone]. If 7PM-7AM, please contact night-coverage at [email or web address], password [facility] 06/23/2015, 2:25 PM  LOS: 2 days

## 2015-06-23 NOTE — Op Note (Signed)
[facility] [address] [place], [address]   ENDOSCOPY PROCEDURE REPORT  PATIENT: Derrick [name], Derrick [name]  MR#: [id number] BIRTHDATE: [birth year] , 44  yrs. old GENDER: male ENDOSCOPIST: Willis Modena, MD REFERRED BY:  [facility] PROCEDURE DATE:  07/05/2015 PROCEDURE:  EGD, diagnostic ASA CLASS:     Class III INDICATIONS:  black stools, coffee ground emesis. MEDICATIONS: Monitored anesthesia care TOPICAL ANESTHETIC:  DESCRIPTION OF PROCEDURE: After the risks benefits and alternatives of the procedure were thoroughly explained, informed consent was obtained.  The Pentax Gastroscope [id number] endoscope was introduced through the mouth and advanced to the second portion of the duodenum. The instrument was slowly withdrawn as the mucosa was fully examined. Estimated blood loss is zero unless otherwise noted in this procedure report.    Findings:  Three columns of Grade II/III mid-to-distal esophageal varices noted, without red wale signs or other stigmata of recent hemorrhage.  Moderate diffuse portal hypertensive gastropathy.  Retroflexion into cardia showed no evidence of gastric varices.  Otherwise normal stomach.  Normal pylorus. Normal duodenum to the second portion.  No old or fresh blood seen to the extent of our examination.          The scope was then withdrawn from the patient and the procedure completed.  COMPLICATIONS: There were no immediate complications.  ENDOSCOPIC IMPRESSION:     As above.  Esophageal varcies; highly unlikely source of bleeding.  Portal gastropathy, likely source of black emesis and black stools.  RECOMMENDATIONS:     1.  Watch for potential complications of procedure. 2.  Cipro 500 mg po bid x 5 more days; even though patient has no SBP, he needs antibiotic prophylaxis to decrease risk of SBP in setting of GI bleeding in patient with ascites. 3.  Clear liquid diet, advance to low sodium (< 2 grams per day)  as tolerated. 4.  Pantoprazole 40 mg po qd. 5.  Propranolol 10 mg po bid as HR/BP tolerates. 6.  Social worker assistance for outpatient alcohol rehabilitation options. 7.  Hopefully home tomorrow, with close outpatient follow-up with [facility].  eSigned:  Willis Modena, MD 07/05/2015 9:37 AM   CC:  CPT CODES: ICD CODES:  The ICD and CPT codes recommended by this software are interpretations from the data that the clinical staff has captured with the software.  The verification of the translation of this report to the ICD and CPT codes and modifiers is the sole responsibility of the health care institution and practicing physician where this report was generated.  [employer] will not be held responsible for the validity of the ICD and CPT codes included on this report.  AMA assumes no liability for data contained or not contained herein. CPT is a Publishing rights manager of the [employer].

## 2015-06-23 NOTE — Transfer of Care (Signed)
Immediate Anesthesia Transfer of Care Note  Patient: Derrick [name]  Procedure(s) Performed: Procedure(s): ESOPHAGOGASTRODUODENOSCOPY (EGD) WITH PROPOFOL (N/A)  Patient Location: PACU and Endoscopy Unit  Anesthesia Type:MAC  Level of Consciousness: awake and patient cooperative  Airway & Oxygen Therapy: Patient Spontanous Breathing and Patient connected to nasal cannula oxygen  Post-op Assessment: Report given to RN and Post -op Vital signs reviewed and stable  Post vital signs: Reviewed and stable  Last Vitals:  Filed Vitals:   06/23/15 0800 06/23/15 0825  BP: 151/90 154/99  Pulse: 111 107  Temp:  36.8 C  Resp: 22 17    Complications: No apparent anesthesia complications

## 2015-06-23 NOTE — Anesthesia Preprocedure Evaluation (Signed)
Anesthesia Evaluation  Patient identified by MRN, date of birth, ID band Patient awake    Reviewed: Allergy & Precautions, NPO status , Patient's Chart, lab work & pertinent test results  History of Anesthesia Complications Negative for: history of anesthetic complications  Airway Mallampati: III  TM Distance: >3 FB Neck ROM: Full    Dental no notable dental hx. (+) Dental Advisory Given, Poor Dentition, Missing, Chipped   Pulmonary COPD, Current Smoker,    Pulmonary exam normal breath sounds clear to auscultation       Cardiovascular hypertension, Normal cardiovascular exam Rhythm:Regular Rate:Normal     Neuro/Psych negative neurological ROS  negative psych ROS   GI/Hepatic negative GI ROS, (+)     substance abuse  alcohol use and marijuana use, Last ETOH was 2/18, on CIWA protocol   Endo/Other  negative endocrine ROS  Renal/GU negative Renal ROS  negative genitourinary   Musculoskeletal negative musculoskeletal ROS (+)   Abdominal   Peds negative pediatric ROS (+)  Hematology negative hematology ROS (+)   Anesthesia Other Findings Denies nausea and vomiting since 2 days prior  Reproductive/Obstetrics negative OB ROS                             Anesthesia Physical Anesthesia Plan  ASA: III  Anesthesia Plan: MAC   Post-op Pain Management:    Induction: Intravenous  Airway Management Planned: Nasal Cannula  Additional Equipment:   Intra-op Plan:   Post-operative Plan:   Informed Consent: I have reviewed the patients History and Physical, chart, labs and discussed the procedure including the risks, benefits and alternatives for the proposed anesthesia with the patient or authorized representative who has indicated his/her understanding and acceptance.   Dental advisory given  Plan Discussed with: CRNA  Anesthesia Plan Comments:         Anesthesia Quick  Evaluation

## 2015-06-23 NOTE — Progress Notes (Signed)
Patient BP 174/116 with MAP of 136. Dr. Benjamine Mola notified at 1225. Awaiting orders. Will continue to monitor.

## 2015-06-23 NOTE — Interval H&P Note (Signed)
History and Physical Interval Note:  06/23/2015 8:56 AM  Derrick [name]  has presented today for surgery, with the diagnosis of coffee ground emesis, dark stool, abdominal pain  The various methods of treatment have been discussed with the patient and family. After consideration of risks, benefits and other options for treatment, the patient has consented to  Procedure(s): ESOPHAGOGASTRODUODENOSCOPY (EGD) WITH PROPOFOL (N/A) as a surgical intervention .  The patient's history has been reviewed, patient examined, no change in status, stable for surgery.  I have reviewed the patient's chart and labs.  Questions were answered to the patient's satisfaction.     Derrick [name] M  Assessment:  1.  Melena. 2.  Coffee ground emesis. 3.  Abdominal pain.  Plan:  1.  Endoscopy. 2.  Risks (bleeding, infection, bowel perforation that could require surgery, sedation-related changes in cardiopulmonary systems), benefits (identification and possible treatment of source of symptoms, exclusion of certain causes of symptoms), and alternatives (watchful waiting, radiographic imaging studies, empiric medical treatment) of upper endoscopy (EGD) were explained to patient/family in detail and patient wishes to proceed.

## 2015-06-23 NOTE — Anesthesia Postprocedure Evaluation (Signed)
Anesthesia Post Note  Patient: Derrick [name]  Procedure(s) Performed: Procedure(s) (LRB): ESOPHAGOGASTRODUODENOSCOPY (EGD) WITH PROPOFOL (N/A)  Patient location during evaluation: PACU Anesthesia Type: MAC Level of consciousness: awake and alert Pain management: pain level controlled Vital Signs Assessment: post-procedure vital signs reviewed and stable Respiratory status: spontaneous breathing, nonlabored ventilation, respiratory function stable and patient connected to nasal cannula oxygen Cardiovascular status: blood pressure returned to baseline and stable Postop Assessment: no signs of nausea or vomiting Anesthetic complications: no    Last Vitals:  Filed Vitals:   06/23/15 1144 06/23/15 1200  BP:  181/105  Pulse:  81  Temp: 36.7 C   Resp:  11    Last Pain:  Filed Vitals:   06/23/15 1209  PainSc: 3                  Bridgitte Felicetti JENNETTE

## 2015-06-24 ENCOUNTER — Encounter (HOSPITAL_COMMUNITY): Payer: Self-pay | Admitting: Gastroenterology

## 2015-06-24 LAB — BASIC METABOLIC PANEL
Anion gap: 9 (ref 5–15)
BUN: 16 mg/dL (ref 6–20)
CALCIUM: 7.1 mg/dL — AB (ref 8.9–10.3)
CO2: 30 mmol/L (ref 22–32)
CREATININE: 0.82 mg/dL (ref 0.61–1.24)
Chloride: 97 mmol/L — ABNORMAL LOW (ref 101–111)
GFR calc non Af Amer: >60 mL/min (ref >60)
Glucose, Bld: 173 mg/dL — ABNORMAL HIGH (ref 65–99)
Potassium: 3.2 mmol/L — ABNORMAL LOW (ref 3.5–5.1)
SODIUM: 136 mmol/L (ref 135–145)

## 2015-06-24 LAB — CBC
HCT: 33.3 % — ABNORMAL LOW (ref 39.0–52.0)
Hemoglobin: 11.1 g/dL — ABNORMAL LOW (ref 13.0–17.0)
MCH: 35.4 pg — AB (ref 26.0–34.0)
MCHC: 33.3 g/dL (ref 30.0–36.0)
MCV: 106.1 fL — ABNORMAL HIGH (ref 78.0–100.0)
Platelets: 74 10*3/uL — ABNORMAL LOW (ref 150–400)
RBC: 3.14 MIL/uL — ABNORMAL LOW (ref 4.22–5.81)
RDW: 14.5 % (ref 11.5–15.5)
WBC: 7 10*3/uL (ref 4.0–10.5)

## 2015-06-24 LAB — ACID FAST SMEAR (AFB): ACID FAST SMEAR - AFSCU2: NEGATIVE

## 2015-06-24 LAB — GLUCOSE, CAPILLARY: GLUCOSE-CAPILLARY: 132 mg/dL — AB (ref 65–99)

## 2015-06-24 MED ORDER — SPIRONOLACTONE 100 MG PO TABS: 100.0000 mg | ORAL_TABLET | Freq: Every day | ORAL | Status: AC

## 2015-06-24 MED ORDER — PANTOPRAZOLE SODIUM 40 MG PO TBEC: 40.0000 mg | DELAYED_RELEASE_TABLET | Freq: Every day | ORAL | Status: AC

## 2015-06-24 MED ORDER — FUROSEMIDE 40 MG PO TABS: 40.0000 mg | ORAL_TABLET | Freq: Every day | ORAL | Status: AC

## 2015-06-24 MED ORDER — PROPRANOLOL HCL 10 MG PO TABS: 10.0000 mg | ORAL_TABLET | Freq: Two times a day (BID) | ORAL | Status: AC

## 2015-06-24 MED ORDER — CIPROFLOXACIN HCL 500 MG PO TABS: 500.0000 mg | ORAL_TABLET | Freq: Two times a day (BID) | ORAL | Status: AC

## 2015-06-24 MED ORDER — POTASSIUM CHLORIDE CRYS ER 20 MEQ PO TBCR: 40.0000 meq | EXTENDED_RELEASE_TABLET | Freq: Once | ORAL | Status: DC

## 2015-06-24 NOTE — Discharge Summary (Signed)
Physician Discharge Summary  Derrick [name] ZOX:[id number] DOB: 1970/08/11 DOA: 06/21/2015  PCP: No primary care provider on file.  Admit date: 06/21/2015 Discharge date: 06/24/2015  Time spent: 35 minutes  Recommendations for Outpatient Follow-up:  1. BMP 1 week 2. HgbA1C pending   Discharge Diagnoses:  Principal Problem:   Acute GI bleeding Active Problems:   GI bleed   COPD with acute exacerbation (HCC)   Lump in neck   Ascites of liver   Discharge Condition: improved  Diet recommendation: carb mod/heart healthy  Filed Weights   06/21/15 2010 06/23/15 0825  Weight: 75.6 kg (166 lb 10.7 oz) 75.297 kg (166 lb)    History of present illness:  45 year old male with a history of alcoholism, nicotine dependence, brought in via EMS because of a near-syncopal episode, after he had coffee-ground emesis that started 2-3 days ago. Patient has also noted melena for the last 1 week. He is also noticed increasing abdominal distention for the last several weeks. He also complains of exertional dyspnea and wheezing. He smokes about 1 pack a day. Patient was initially hypertensive with a blood pressure in the 180s, subsequently blood pressure was 132 /95. Patient found to be tachycardic in the 140s. Patient denies any recent weight loss. Patient has also noticed a lump along the right side of his neck which has been increasing in size for the last couple of years. Patient admits to drinking on a regular basis and mostly drinks beer and vodka. Patient found to be tachycardic but otherwise hemodynamically stable, hemoglobin 12.0, creatinine 1.02, bilirubin 2.7. White blood cell count 10.7. EtOH level 369. INR 1.67   Hospital Course:  Acute GI bleeding -presented with coffee-ground emesis, likely secondary to esophagitis versus portal hypertensive gastropathy in the setting of daily alcohol use -octreotide and Protonix drip- d/c'd S/p EGD- see below Gastroenterology, Dr. Dulce Sellar has been  consulted  U/S shows: Cirrhosis as well as an indeterminate hypoechoic 3.7 cm focus in the central liver, cannot exclude a liver mass.  -serum AFP tumor marker negative -cipro for SBP prophylaxis watch Qtc -propanolol  Ascites likely secondary to alcoholic cirrhosis Dr. Dulce Sellar recommends albumin administration followed by paracentesis (5.1L removed) -lasix/aldactone  Alcohol abuse - no longer showing signs of withdrawal  HTN -PRns Lasix/aldactone -follow outpatient BMP for Cr and K  Macrocytic anemia, thrombocytopenia Likely in the setting of alcohol use, avoid heparin products  Coagulopathy of the liver Suspect likely secondary to alcoholic cirrhosis of the liver  Acute COPD exacerbation -change to PO steroids, nebulizer treatments D/c abx  Hyperglycemia -check HgbA1C -diabetic diet  Lump in the neck - likely dermal inclusion cyst or other benign lesion. Location is readily amenable to excisional biopsy-outpatient   Procedures: WJX:[id number] varcies; highly unlikely source of bleeding. Portal gastropathy, likely source of black emesis and black stools.   Consultations:  GI  Social work for alcohol rehab  Discharge Exam: Filed Vitals:   06/24/15 0625 06/24/15 0921  BP: 143/84 139/82  Pulse: 69 82  Temp: 98 F (36.7 C)   Resp: 18       Discharge Instructions   Discharge Instructions    Diet Carb Modified    Complete by:  As directed      Discharge instructions    Complete by:  As directed   Stop drinking, stop smoking BMP 1 week     Increase activity slowly    Complete by:  As directed           Current  Discharge Medication List    START taking these medications   Details  ciprofloxacin (CIPRO) 500 MG tablet Take 1 tablet (500 mg total) by mouth 2 (two) times daily. Qty: 8 tablet, Refills: 0    furosemide (LASIX) 40 MG tablet Take 1 tablet (40 mg total) by mouth daily. Qty: 30 tablet, Refills: 0    pantoprazole (PROTONIX) 40 MG  tablet Take 1 tablet (40 mg total) by mouth daily. Qty: 30 tablet, Refills: 0    propranolol (INDERAL) 10 MG tablet Take 1 tablet (10 mg total) by mouth 2 (two) times daily. Qty: 60 tablet, Refills: 0    spironolactone (ALDACTONE) 100 MG tablet Take 1 tablet (100 mg total) by mouth daily. Qty: 30 tablet, Refills: 0      STOP taking these medications     diphenhydrAMINE (SOMINEX) 25 MG tablet        No Known Allergies    The results of significant diagnostics from this hospitalization (including imaging, microbiology, ancillary and laboratory) are listed below for reference.    Significant Diagnostic Studies: Ct Soft Tissue Neck W Contrast  06/21/2015  CLINICAL DATA:  Vomiting and black emesis. Abdominal distention. Respiratory distress. Lump on right side of neck which has been increasing for a couple of years. EXAM: CT OF THE NECK WITH CONTRAST CT OF THE CHEST WITH CONTRAST CT OF THE ABDOMEN AND PELVIS WITH CONTRAST TECHNIQUE: Multidetector CT imaging of the neck was performed with intravenous contrast.; Multidetector CT imaging of the abdomen and pelvis was performed following the standard protocol during bolus administration of intravenous contrast.; Multidetector CT imaging of the chest was performed following the standard protocol during bolus administration of intravenous contrast. CONTRAST:  OMNIPAQUE IOHEXOL 300 MG/ML  SOLN COMPARISON:  None. FINDINGS: CT NECK FINDINGS The right neck palpable abnormality correlates with a ovoid immediately subcutaneous low-density mass with smooth, measuring 18 mm. This contacts the posterior margin of the sternocleidomastoid. No superimposed inflammatory changes. Pharynx and larynx: No suspicious enhancement or other asymmetry. Salivary glands: Negative Thyroid: Negative Lymph nodes: No enlarged or necrotic appearing lymph nodes. Low-density rounded in tubular structures in the left supraclavicular fossa which are likely lymphatic. Vascular:  Carotid bifurcation atherosclerosis without evidence of flow limiting stenosis. Limited intracranial: Negative Visualized orbits: Part visualization is negative Mastoids and visualized paranasal sinuses: Clear Skeleton: No acute or aggressive process CT CHEST FINDINGS THORACIC INLET/BODY WALL: No acute abnormality. MEDIASTINUM: Borderline cardiomegaly. No pericardial effusion. Atherosclerosis, extensive along the LAD, age advanced. No acute vascular abnormality. Diffuse mild thickening of the patulous esophagus without discrete varix. No adenopathy. LUNG WINDOWS: There is no edema, consolidation, effusion, or pneumothorax. Mild paraseptal emphysema at the apices. OSSEOUS: No acute fracture.  No suspicious lytic or blastic lesions. CT ABDOMEN PELVIS FINDINGS Hepatobiliary: Cirrhotic liver morphology with large fissures, liver capsule undulation, hypertrophied hepatic artery, and ovegrown caudate. No masslike enhancement is seen. Patchy low density compatible with steatosis. No evidence of biliary obstruction or stone. Pancreas: Unremarkable. Spleen: Unremarkable.  No enlargement. Adrenals/Urinary Tract: Negative adrenals. 3 mm stone at the left ureteral vesicular junction with no hydronephrosis. Posterior hilar lip cyst on the left measuring 15 mm . Prominent bladder wall thickness without superimposed fat inflammation Reproductive:No pathologic findings. Stomach/Bowel: The bowel is diffusely thickened, from the stomach to the rectum, with submucosal low density edema. Mesenteric vessels are engorged. There could be small mural varices at the GE junction, but limited by oral contrast in the rugae. No obstruction. No appendicitis. Vascular/Lymphatic: No acute vascular  abnormality. Premature atherosclerosis. No portal venous occlusion. No mass or adenopathy. Peritoneal: Small ascites. 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 IMPRESSION: 1. Cirrhotic liver morphology with steatosis. 2.  Gastric, small bowel, and colonic thickening with submucosal edema. Given diffuse appearance favor portal hypertension with hypoproteinemia over infectious/inflammatory process. 3. Non loculated ascites.  Recent paracentesis. 4. Nonobstructive 3 mm stone at the left ureteral vesicular junction. 5. The right neck palpable complaint correlates with a 18 mm subcutaneous cyst, likely dermal inclusion cyst or other benign lesion. Location is readily amenable to excisional biopsy. 6. No acute cardiopulmonary disease. Age advanced atherosclerosis, extensive in the coronary circulation. Electronically Signed   By: Marnee Spring M.D.   On: 06/21/2015 18:12   Ct Chest W Contrast  06/21/2015  CLINICAL DATA:  Vomiting and black emesis. Abdominal distention. Respiratory distress. Lump on right side of neck which has been increasing for a couple of years. EXAM: CT OF THE NECK WITH CONTRAST CT OF THE CHEST WITH CONTRAST CT OF THE ABDOMEN AND PELVIS WITH CONTRAST TECHNIQUE: Multidetector CT imaging of the neck was performed with intravenous contrast.; Multidetector CT imaging of the abdomen and pelvis was performed following the standard protocol during bolus administration of intravenous contrast.; Multidetector CT imaging of the chest was performed following the standard protocol during bolus administration of intravenous contrast. CONTRAST:  OMNIPAQUE IOHEXOL 300 MG/ML  SOLN COMPARISON:  None. FINDINGS: CT NECK FINDINGS The right neck palpable abnormality correlates with a ovoid immediately subcutaneous low-density mass with smooth, measuring 18 mm. This contacts the posterior margin of the sternocleidomastoid. No superimposed inflammatory changes. Pharynx and larynx: No suspicious enhancement or other asymmetry. Salivary glands: Negative Thyroid: Negative Lymph nodes: No enlarged or necrotic appearing lymph nodes. Low-density rounded in tubular structures in the left supraclavicular fossa which are likely lymphatic.  Vascular: Carotid bifurcation atherosclerosis without evidence of flow limiting stenosis. Limited intracranial: Negative Visualized orbits: Part visualization is negative Mastoids and visualized paranasal sinuses: Clear Skeleton: No acute or aggressive process CT CHEST FINDINGS THORACIC INLET/BODY WALL: No acute abnormality. MEDIASTINUM: Borderline cardiomegaly. No pericardial effusion. Atherosclerosis, extensive along the LAD, age advanced. No acute vascular abnormality. Diffuse mild thickening of the patulous esophagus without discrete varix. No adenopathy. LUNG WINDOWS: There is no edema, consolidation, effusion, or pneumothorax. Mild paraseptal emphysema at the apices. OSSEOUS: No acute fracture.  No suspicious lytic or blastic lesions. CT ABDOMEN PELVIS FINDINGS Hepatobiliary: Cirrhotic liver morphology with large fissures, liver capsule undulation, hypertrophied hepatic artery, and ovegrown caudate. No masslike enhancement is seen. Patchy low density compatible with steatosis. No evidence of biliary obstruction or stone. Pancreas: Unremarkable. Spleen: Unremarkable.  No enlargement. Adrenals/Urinary Tract: Negative adrenals. 3 mm stone at the left ureteral vesicular junction with no hydronephrosis. Posterior hilar lip cyst on the left measuring 15 mm . Prominent bladder wall thickness without superimposed fat inflammation Reproductive:No pathologic findings. Stomach/Bowel: The bowel is diffusely thickened, from the stomach to the rectum, with submucosal low density edema. Mesenteric vessels are engorged. There could be small mural varices at the GE junction, but limited by oral contrast in the rugae. No obstruction. No appendicitis. Vascular/Lymphatic: No acute vascular abnormality. Premature atherosclerosis. No portal venous occlusion. No mass or adenopathy. Peritoneal: Small ascites. 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 IMPRESSION: 1. Cirrhotic liver morphology with  steatosis. 2. Gastric, small bowel, and colonic thickening with submucosal edema. Given diffuse appearance favor portal hypertension with hypoproteinemia over infectious/inflammatory process. 3. Non loculated ascites.  Recent paracentesis. 4. Nonobstructive  3 mm stone at the left ureteral vesicular junction. 5. The right neck palpable complaint correlates with a 18 mm subcutaneous cyst, likely dermal inclusion cyst or other benign lesion. Location is readily amenable to excisional biopsy. 6. No acute cardiopulmonary disease. Age advanced atherosclerosis, extensive in the coronary circulation. Electronically Signed   By: Marnee Spring M.D.   On: 06/21/2015 18:12   Ct Abdomen Pelvis W Contrast  06/21/2015  CLINICAL DATA:  Vomiting and black emesis. Abdominal distention. Respiratory distress. Lump on right side of neck which has been increasing for a couple of years. EXAM: CT OF THE NECK WITH CONTRAST CT OF THE CHEST WITH CONTRAST CT OF THE ABDOMEN AND PELVIS WITH CONTRAST TECHNIQUE: Multidetector CT imaging of the neck was performed with intravenous contrast.; Multidetector CT imaging of the abdomen and pelvis was performed following the standard protocol during bolus administration of intravenous contrast.; Multidetector CT imaging of the chest was performed following the standard protocol during bolus administration of intravenous contrast. CONTRAST:  OMNIPAQUE IOHEXOL 300 MG/ML  SOLN COMPARISON:  None. FINDINGS: CT NECK FINDINGS The right neck palpable abnormality correlates with a ovoid immediately subcutaneous low-density mass with smooth, measuring 18 mm. This contacts the posterior margin of the sternocleidomastoid. No superimposed inflammatory changes. Pharynx and larynx: No suspicious enhancement or other asymmetry. Salivary glands: Negative Thyroid: Negative Lymph nodes: No enlarged or necrotic appearing lymph nodes. Low-density rounded in tubular structures in the left supraclavicular fossa which  are likely lymphatic. Vascular: Carotid bifurcation atherosclerosis without evidence of flow limiting stenosis. Limited intracranial: Negative Visualized orbits: Part visualization is negative Mastoids and visualized paranasal sinuses: Clear Skeleton: No acute or aggressive process CT CHEST FINDINGS THORACIC INLET/BODY WALL: No acute abnormality. MEDIASTINUM: Borderline cardiomegaly. No pericardial effusion. Atherosclerosis, extensive along the LAD, age advanced. No acute vascular abnormality. Diffuse mild thickening of the patulous esophagus without discrete varix. No adenopathy. LUNG WINDOWS: There is no edema, consolidation, effusion, or pneumothorax. Mild paraseptal emphysema at the apices. OSSEOUS: No acute fracture.  No suspicious lytic or blastic lesions. CT ABDOMEN PELVIS FINDINGS Hepatobiliary: Cirrhotic liver morphology with large fissures, liver capsule undulation, hypertrophied hepatic artery, and ovegrown caudate. No masslike enhancement is seen. Patchy low density compatible with steatosis. No evidence of biliary obstruction or stone. Pancreas: Unremarkable. Spleen: Unremarkable.  No enlargement. Adrenals/Urinary Tract: Negative adrenals. 3 mm stone at the left ureteral vesicular junction with no hydronephrosis. Posterior hilar lip cyst on the left measuring 15 mm . Prominent bladder wall thickness without superimposed fat inflammation Reproductive:No pathologic findings. Stomach/Bowel: The bowel is diffusely thickened, from the stomach to the rectum, with submucosal low density edema. Mesenteric vessels are engorged. There could be small mural varices at the GE junction, but limited by oral contrast in the rugae. No obstruction. No appendicitis. Vascular/Lymphatic: No acute vascular abnormality. Premature atherosclerosis. No portal venous occlusion. No mass or adenopathy. Peritoneal: Small ascites. 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 IMPRESSION: 1. Cirrhotic  liver morphology with steatosis. 2. Gastric, small bowel, and colonic thickening with submucosal edema. Given diffuse appearance favor portal hypertension with hypoproteinemia over infectious/inflammatory process. 3. Non loculated ascites.  Recent paracentesis. 4. Nonobstructive 3 mm stone at the left ureteral vesicular junction. 5. The right neck palpable complaint correlates with a 18 mm subcutaneous cyst, likely dermal inclusion cyst or other benign lesion. Location is readily amenable to excisional biopsy. 6. No acute cardiopulmonary disease. Age advanced atherosclerosis, extensive in the coronary circulation. Electronically Signed   By: Kathrynn Ducking.D.  On: 06/21/2015 18:12   US Paracentesis  06/21/2015  INDICATION: Patient with a near syncopal episode and possible coffee-ground emesis this morning. He is brought to the emergency apartment via EMS. He was noted to have abdominal distention with elevated liver function test and felt to likely have ascites. Paracentesis was requested. EXAM: ULTRASOUND GUIDED DIAGNOSTIC AND THERAPEUTIC PARACENTESIS MEDICATIONS: 1% lidocaine COMPLICATIONS: None immediate. PROCEDURE: Informed written consent was obtained from the patient after a discussion of the risks, benefits and alternatives to treatment. A timeout was performed prior to the initiation of the procedure. Initial ultrasound scanning demonstrates a large amount of ascites within the left lower abdominal quadrant. The left lower abdomen was prepped and draped in the usual sterile fashion. 1% lidocaine was used for local anesthesia. Following this, a Safe-T-Centesis catheter was introduced. An ultrasound image was saved for documentation purposes. The paracentesis was performed. The catheter was removed and a dressing was applied. The patient tolerated the procedure well without immediate post procedural complication. FINDINGS: A total of approximately 5.1 L of clear yellow fluid was removed. Samples were  sent to the laboratory as requested by the clinical team. IMPRESSION: Successful ultrasound-guided paracentesis yielding 5.1 liters of peritoneal fluid. Read by: Barnetta Chapel, PA-C Electronically Signed   By: Corlis Leak M.D.   On: 06/21/2015 16:23   US Abdomen Limited Ruq  06/21/2015  CLINICAL DATA:  Abdominal distention. Elevated liver function tests. Ascites. Post paracentesis earlier today. EXAM: US ABDOMEN LIMITED - RIGHT UPPER QUADRANT COMPARISON:  06/21/2015 CT abdomen/pelvis. FINDINGS: Gallbladder: Nondistended gallbladder. Moderate diffuse gallbladder wall thickening (6 mm in thickness). Layering sludge. No evidence of cholelithiasis. Pericholecystic fluid. No sonographic Murphy sign. Common bile duct: Diameter: 5 mm Liver: Liver surface is diffusely finely irregular and the liver parenchyma is diffusely coarsened in echotexture, in keeping with cirrhosis. There is a hypoechoic 3.4 x 3.7 x 3.4 cm focus measured by the technologist in the central liver, which is indeterminate, cannot exclude a liver mass in this location. The main portal vein is patent with appropriate flow direction. There is small to moderate perihepatic ascites. IMPRESSION: 1. Cirrhosis. 2. Indeterminate hypoechoic 3.7 cm focus in the central liver, cannot exclude a liver mass. Recommend correlation with serum AFP. A liver mass protocol MRI abdomen with and without IV contrast should be performed on a short term outpatient basis for further evaluation. If MRI is not feasible or contraindicated, a triphasic protocol CT of the abdomen with and without IV contrast is recommended. 3. Small to moderate volume perihepatic ascites. 4. Nondistended thick-walled gallbladder with layering sludge. No cholelithiasis. No sonographic Murphy sign. The gallbladder wall thickening is likely reactive or due to non inflammatory edema (i.e. due to portal hypertension and/or hypoalbuminemia). 5. No biliary ductal dilatation. Electronically Signed   By:  Delbert Phenix M.D.   On: 06/21/2015 18:06    Microbiology: Recent Results (from the past 240 hour(s))  Culture, body fluid-bottle     Status: None (Preliminary result)   Collection Time: 06/21/15  3:49 PM  Result Value Ref Range Status   Specimen Description FLUID PERITONEAL  Final   Special Requests BOTTLES DRAWN AEROBIC AND ANAEROBIC 10CC  Final   Culture   Final    NO GROWTH 2 DAYS Performed at [facility]    Report Status PENDING  Incomplete  Gram stain     Status: None   Collection Time: 06/21/15  3:49 PM  Result Value Ref Range Status   Specimen Description FLUID PERITONEAL  Final   Special Requests NONE  Final   Gram Stain   Final    MODERATE WBC PRESENT, PREDOMINANTLY MONONUCLEAR NO ORGANISMS SEEN Performed at [facility]    Report Status 06/21/2015 FINAL  Final  MRSA PCR Screening     Status: Abnormal   Collection Time: 06/21/15  8:13 PM  Result Value Ref Range Status   MRSA by PCR POSITIVE (A) NEGATIVE Final    Comment:        The GeneXpert MRSA Assay (FDA approved for NASAL specimens only), is one component of a comprehensive MRSA colonization surveillance program. It is not intended to diagnose MRSA infection nor to guide or monitor treatment for MRSA infections. RESULT CALLED TO, READ BACK BY AND VERIFIED WITH: [facility] [id number] @ 2127 BY J SCOTTON      Labs: Basic Metabolic Panel:  Recent Labs Lab 06/21/15 1200 06/21/15 1300 06/22/15 0338 06/23/15 0326 06/24/15 0505  NA 136  --  140 138 136  K 3.8  --  4.5 3.9 3.2*  CL 99*  --  105 103 97*  CO2 23  --  22 25 30   GLUCOSE 198*  --  179* 236* 173*  BUN 23*  --  14 14 16   CREATININE 1.02  --  0.72 0.65 0.82  CALCIUM 7.8*  --  6.9* 6.7* 7.1*  MG  --  1.4*  --   --   --    Liver Function Tests:  Recent Labs Lab 06/21/15 1200 06/22/15 0338  AST 115* 90*  ALT 34 26  ALKPHOS 114 77  BILITOT 2.7* 3.0*  PROT 7.3 6.0*  ALBUMIN 2.9* 2.8*    Recent Labs Lab  06/21/15 1200  LIPASE 49   No results for input(s): AMMONIA in the last 168 hours. CBC:  Recent Labs Lab 06/21/15 1208 06/21/15 1820 06/21/15 2300 06/22/15 0338 06/23/15 0326 06/24/15 0505  WBC 10.7* 9.1 8.3 6.7 4.0 7.0  NEUTROABS 8.7*  --   --   --   --   --   HGB 12.0* 11.3* 10.1* 9.8* 9.8* 11.1*  HCT 36.5* 33.6* 28.8* 29.6* 29.2* 33.3*  MCV 105.8* 105.3* 104.3* 106.1* 106.2* 106.1*  PLT 175 127* 99* 93* 71* 74*   Cardiac Enzymes:  Recent Labs Lab 06/21/15 1300 06/21/15 2034 06/22/15 0338  TROPONINI <0.03 <0.03 <0.03   BNP: BNP (last 3 results) No results for input(s): BNP in the last 8760 hours.  ProBNP (last 3 results) No results for input(s): PROBNP in the last 8760 hours.  CBG:  Recent Labs Lab 06/23/15 1339 06/23/15 1704 06/23/15 2205 06/24/15 0810  GLUCAP 188* 262* 137* 132*       Signed:  Milford Cilento U [place] Serpe DO [facility] 06/24/2015, 11:17 AM

## 2015-06-24 NOTE — Progress Notes (Signed)
Patient discharged  in stable condition via wheelchair.  Educated pt regarding diagnosis, signs and symptoms to report, medications.  Prescriptions given.  Discharge instructions given.  Teach back completed.

## 2015-06-24 NOTE — Progress Notes (Signed)
A call to Shanda Bumps, RN with Transitional [facility] at [facility] for an appointment and transportation needs. Pt can purchase medications there as well. Appointment 07/20/2015 at 11 AM.  Pt is aware.

## 2015-06-24 NOTE — Progress Notes (Signed)
CSW met with patient & mother, Debra at bedside re: ETOH abuse. Patient seemed disinterested in treatment but told CSW that he would "look into it". CSW provided patient with list of Residential & Outpatient Treatment resources. CSW spoke with patient's mother in the hallway who expressed concern about patient and his drinking - CSW encouraged patient's mother to take patient to [facility] to get evaluated for possible Residential Treatment.   No further CSW needs identified - CSW signing off.   Kelly Harrison, LCSW [facility] Clinical Social Worker cell #: [phone]   

## 2015-06-24 NOTE — Hospital Discharge Follow-Up (Signed)
[facility]:  This Case Manager received call from Geni Bers, RN CM who indicated patient needing hospital follow-up appointment. Hospitalized for GI bleed.  This Case Manager spoke with patient. Patient confirms he does not have a PCP and is uninsured. Informed patient of [facility] [employer] and informed patient of onsite pharmacy and Health visitor. Patient appreciative of information. Appointment scheduled for 07/28/2015 at 1100 with Dr. Felicity Coyer. AVS updated. Patient uncertain if he will need transportation but indicated he would notify clinic if he does. [place], [facility], updated. Geni Bers, RN CM also updated.

## 2015-06-24 NOTE — Progress Notes (Signed)
Subjective: No abdominal pain. No further black emesis or stools. Wants to go home.  Objective: Vital signs in last 24 hours: Temp:  [98 F (36.7 C)-98.6 F (37 C)] 98 F (36.7 C) (02/24 0625) Pulse Rate:  [69-94] 82 (02/24 0921) Resp:  [14-18] 18 (02/24 0625) BP: (137-182)/(82-112) 139/82 mmHg (02/24 0921) SpO2:  [94 %-100 %] 100 % (02/24 0625) Weight change:  Last BM Date: 06/23/15  PE: GEN:  NAD ABD:  Mild distention, soft, non-tender  Lab Results: CBC    Component Value Date/Time   WBC 7.0 06/24/2015 0505   RBC 3.14* 06/24/2015 0505   HGB 11.1* 06/24/2015 0505   HCT 33.3* 06/24/2015 0505   PLT 74* 06/24/2015 0505   MCV 106.1* 06/24/2015 0505   MCH 35.4* 06/24/2015 0505   MCHC 33.3 06/24/2015 0505   RDW 14.5 06/24/2015 0505   LYMPHSABS 1.3 06/21/2015 1208   MONOABS 0.8 06/21/2015 1208   EOSABS 0.0 06/21/2015 1208   BASOSABS 0.0 06/21/2015 1208   CMP     Component Value Date/Time   NA 136 06/24/2015 0505   K 3.2* 06/24/2015 0505   CL 97* 06/24/2015 0505   CO2 30 06/24/2015 0505   GLUCOSE 173* 06/24/2015 0505   BUN 16 06/24/2015 0505   CREATININE 0.82 06/24/2015 0505   CALCIUM 7.1* 06/24/2015 0505   PROT 6.0* 06/22/2015 0338   ALBUMIN 2.8* 06/22/2015 0338   AST 90* 06/22/2015 0338   ALT 26 06/22/2015 0338   ALKPHOS 77 06/22/2015 0338   BILITOT 3.0* 06/22/2015 0338   GFRNONAA >60 06/24/2015 0505   GFRAA >60 06/24/2015 0505   Assessment:  1. 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 2. Ascites, paracentesis SAAG > 1.1, most consistent with portal hypertension (cirrhosis-related). No SBP. 3. Bowel edema, suspect from hypoalbuminemia. 4. Black emesis. Resolved.  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 5. Anemia. 6. Esophageal varices, non-bleeding. 7.  Alcohol abuse.  Plan:  1.  Propranolol 10 mg po bid, now and upon discharge. 2.  Pantoprazole 40 mg po qd, now and upon discharge. 3.  Complete 10-day total course of antibiotics (GI  bleeding with ascites), currently on ciprofloxacin 500 mg po bid. 4.  Low sodium diet, now and upon discharge (< 2 grams per day). 5.  OK with spironolactone and furosemide for ascites. 6.  Alcohol abstinence paramount; social worker to review options for outpatient rehab with patient.  I have been very clear that continued alcohol intake will precipitously shorten his anticipated lifespan and will furthermore make him ineligible to receive liver transplantation should he require one. 7.  Will arrange outpatient follow-up with [facility]. 8.  OK to discharge home from GI perspective.  Case discussed with Dr. Benjamine Mola of hospitalist team.  Thank you for the consultation.  Please call with any further questions.   Derrick [name] 06/24/2015, 12:09 PM   Pager [phone] If no answer or after 5 PM call [phone]

## 2015-06-25 LAB — HEMOGLOBIN A1C
HEMOGLOBIN A1C: 6.2 % — AB (ref 4.8–5.6)
MEAN PLASMA GLUCOSE: 131 mg/dL

## 2015-06-26 LAB — CULTURE, BODY FLUID W GRAM STAIN -BOTTLE

## 2015-06-26 LAB — CULTURE, BODY FLUID-BOTTLE: CULTURE: NO GROWTH

## 2015-06-29 ENCOUNTER — Emergency Department (HOSPITAL_COMMUNITY): Payer: Medicaid Other

## 2015-06-29 ENCOUNTER — Inpatient Hospital Stay (HOSPITAL_COMMUNITY)
Admission: EM | Admit: 2015-06-29 | Discharge: 2015-07-30 | DRG: 368 | Disposition: E | Payer: Medicaid Other | Attending: Internal Medicine | Admitting: Internal Medicine

## 2015-06-29 ENCOUNTER — Inpatient Hospital Stay (HOSPITAL_COMMUNITY): Payer: Medicaid Other

## 2015-06-29 ENCOUNTER — Encounter (HOSPITAL_COMMUNITY): Payer: Self-pay | Admitting: *Deleted

## 2015-06-29 ENCOUNTER — Inpatient Hospital Stay: Payer: Self-pay | Admitting: Internal Medicine

## 2015-06-29 DIAGNOSIS — J449 Chronic obstructive pulmonary disease, unspecified: Secondary | ICD-10-CM | POA: Diagnosis present

## 2015-06-29 DIAGNOSIS — Z79899 Other long term (current) drug therapy: Secondary | ICD-10-CM | POA: Diagnosis not present

## 2015-06-29 DIAGNOSIS — K7682 Hepatic encephalopathy: Secondary | ICD-10-CM | POA: Diagnosis present

## 2015-06-29 DIAGNOSIS — E876 Hypokalemia: Secondary | ICD-10-CM | POA: Diagnosis present

## 2015-06-29 DIAGNOSIS — D62 Acute posthemorrhagic anemia: Secondary | ICD-10-CM | POA: Diagnosis present

## 2015-06-29 DIAGNOSIS — R4182 Altered mental status, unspecified: Secondary | ICD-10-CM

## 2015-06-29 DIAGNOSIS — F10939 Alcohol use, unspecified with withdrawal, unspecified: Secondary | ICD-10-CM | POA: Diagnosis present

## 2015-06-29 DIAGNOSIS — G312 Degeneration of nervous system due to alcohol: Secondary | ICD-10-CM | POA: Diagnosis present

## 2015-06-29 DIAGNOSIS — Z8249 Family history of ischemic heart disease and other diseases of the circulatory system: Secondary | ICD-10-CM | POA: Diagnosis not present

## 2015-06-29 DIAGNOSIS — Z66 Do not resuscitate: Secondary | ICD-10-CM | POA: Diagnosis not present

## 2015-06-29 DIAGNOSIS — F10239 Alcohol dependence with withdrawal, unspecified: Secondary | ICD-10-CM | POA: Diagnosis present

## 2015-06-29 DIAGNOSIS — I959 Hypotension, unspecified: Secondary | ICD-10-CM | POA: Diagnosis present

## 2015-06-29 DIAGNOSIS — I739 Peripheral vascular disease, unspecified: Secondary | ICD-10-CM | POA: Diagnosis present

## 2015-06-29 DIAGNOSIS — R451 Restlessness and agitation: Secondary | ICD-10-CM | POA: Diagnosis not present

## 2015-06-29 DIAGNOSIS — I8501 Esophageal varices with bleeding: Principal | ICD-10-CM | POA: Diagnosis present

## 2015-06-29 DIAGNOSIS — R64 Cachexia: Secondary | ICD-10-CM | POA: Diagnosis present

## 2015-06-29 DIAGNOSIS — K7290 Hepatic failure, unspecified without coma: Secondary | ICD-10-CM | POA: Diagnosis present

## 2015-06-29 DIAGNOSIS — Z452 Encounter for adjustment and management of vascular access device: Secondary | ICD-10-CM

## 2015-06-29 DIAGNOSIS — K7030 Alcoholic cirrhosis of liver without ascites: Secondary | ICD-10-CM | POA: Diagnosis present

## 2015-06-29 DIAGNOSIS — K7031 Alcoholic cirrhosis of liver with ascites: Secondary | ICD-10-CM | POA: Diagnosis present

## 2015-06-29 DIAGNOSIS — N179 Acute kidney failure, unspecified: Secondary | ICD-10-CM | POA: Diagnosis not present

## 2015-06-29 DIAGNOSIS — I82403 Acute embolism and thrombosis of unspecified deep veins of lower extremity, bilateral: Secondary | ICD-10-CM

## 2015-06-29 DIAGNOSIS — K2961 Other gastritis with bleeding: Secondary | ICD-10-CM

## 2015-06-29 DIAGNOSIS — F17210 Nicotine dependence, cigarettes, uncomplicated: Secondary | ICD-10-CM | POA: Diagnosis present

## 2015-06-29 DIAGNOSIS — J9601 Acute respiratory failure with hypoxia: Secondary | ICD-10-CM | POA: Diagnosis not present

## 2015-06-29 DIAGNOSIS — Z720 Tobacco use: Secondary | ICD-10-CM | POA: Diagnosis present

## 2015-06-29 DIAGNOSIS — K922 Gastrointestinal hemorrhage, unspecified: Secondary | ICD-10-CM

## 2015-06-29 DIAGNOSIS — T38995A Adverse effect of other hormone antagonists, initial encounter: Secondary | ICD-10-CM | POA: Diagnosis not present

## 2015-06-29 DIAGNOSIS — F1010 Alcohol abuse, uncomplicated: Secondary | ICD-10-CM | POA: Diagnosis present

## 2015-06-29 DIAGNOSIS — K7041 Alcoholic hepatic failure with coma: Secondary | ICD-10-CM | POA: Diagnosis present

## 2015-06-29 DIAGNOSIS — K2921 Alcoholic gastritis with bleeding: Secondary | ICD-10-CM | POA: Diagnosis present

## 2015-06-29 DIAGNOSIS — D689 Coagulation defect, unspecified: Secondary | ICD-10-CM | POA: Diagnosis present

## 2015-06-29 DIAGNOSIS — R34 Anuria and oliguria: Secondary | ICD-10-CM | POA: Diagnosis not present

## 2015-06-29 DIAGNOSIS — I10 Essential (primary) hypertension: Secondary | ICD-10-CM | POA: Diagnosis present

## 2015-06-29 DIAGNOSIS — R739 Hyperglycemia, unspecified: Secondary | ICD-10-CM | POA: Diagnosis not present

## 2015-06-29 DIAGNOSIS — G9340 Encephalopathy, unspecified: Secondary | ICD-10-CM | POA: Diagnosis present

## 2015-06-29 DIAGNOSIS — Z6826 Body mass index (BMI) 26.0-26.9, adult: Secondary | ICD-10-CM | POA: Diagnosis not present

## 2015-06-29 DIAGNOSIS — E870 Hyperosmolality and hypernatremia: Secondary | ICD-10-CM | POA: Diagnosis present

## 2015-06-29 DIAGNOSIS — Z515 Encounter for palliative care: Secondary | ICD-10-CM | POA: Insufficient documentation

## 2015-06-29 DIAGNOSIS — K7291 Hepatic failure, unspecified with coma: Secondary | ICD-10-CM

## 2015-06-29 HISTORY — DX: Unspecified cirrhosis of liver: K74.60

## 2015-06-29 HISTORY — DX: Gastrointestinal hemorrhage, unspecified: K92.2

## 2015-06-29 HISTORY — DX: Alcohol abuse, uncomplicated: F10.10

## 2015-06-29 LAB — CBC WITH DIFFERENTIAL/PLATELET
BASOS PCT: 0 %
Basophils Absolute: 0 10*3/uL (ref 0.0–0.1)
EOS ABS: 0 10*3/uL (ref 0.0–0.7)
EOS PCT: 0 %
HCT: 14.3 % — ABNORMAL LOW (ref 39.0–52.0)
Hemoglobin: 4.8 g/dL — CL (ref 13.0–17.0)
LYMPHS ABS: 2.6 10*3/uL (ref 0.7–4.0)
Lymphocytes Relative: 14 %
MCH: 35.6 pg — AB (ref 26.0–34.0)
MCHC: 33.6 g/dL (ref 30.0–36.0)
MCV: 105.9 fL — ABNORMAL HIGH (ref 78.0–100.0)
MONO ABS: 3 10*3/uL — AB (ref 0.1–1.0)
MONOS PCT: 16 %
NEUTROS PCT: 70 %
Neutro Abs: 13.1 10*3/uL — ABNORMAL HIGH (ref 1.7–7.7)
PLATELETS: 168 10*3/uL (ref 150–400)
RBC: 1.35 MIL/uL — ABNORMAL LOW (ref 4.22–5.81)
RDW: 16 % — AB (ref 11.5–15.5)
WBC: 18.8 10*3/uL — ABNORMAL HIGH (ref 4.0–10.5)

## 2015-06-29 LAB — COMPREHENSIVE METABOLIC PANEL
ALBUMIN: 1.7 g/dL — AB (ref 3.5–5.0)
ALK PHOS: 52 U/L (ref 38–126)
ALT: <5 U/L — ABNORMAL LOW (ref 17–63)
ANION GAP: 35 — AB (ref 5–15)
AST: <5 U/L — ABNORMAL LOW (ref 15–41)
BUN: 98 mg/dL — ABNORMAL HIGH (ref 6–20)
CALCIUM: 7.5 mg/dL — AB (ref 8.9–10.3)
CO2: 16 mmol/L — AB (ref 22–32)
Chloride: 83 mmol/L — ABNORMAL LOW (ref 101–111)
Creatinine, Ser: 6.02 mg/dL — ABNORMAL HIGH (ref 0.61–1.24)
GFR calc non Af Amer: 10 mL/min — ABNORMAL LOW (ref >60)
GFR, EST AFRICAN AMERICAN: 12 mL/min — AB (ref >60)
GLUCOSE: 198 mg/dL — AB (ref 65–99)
POTASSIUM: 4 mmol/L (ref 3.5–5.1)
SODIUM: 134 mmol/L — AB (ref 135–145)
Total Bilirubin: 4.6 mg/dL — ABNORMAL HIGH (ref 0.3–1.2)
Total Protein: 4.2 g/dL — ABNORMAL LOW (ref 6.5–8.1)

## 2015-06-29 LAB — PROTIME-INR
INR: 4.08 — ABNORMAL HIGH (ref 0.00–1.49)
Prothrombin Time: 38.6 seconds — ABNORMAL HIGH (ref 11.6–15.2)

## 2015-06-29 LAB — PREPARE FRESH FROZEN PLASMA
UNIT DIVISION: 0
UNIT DIVISION: 0

## 2015-06-29 LAB — ETHANOL: Alcohol, Ethyl (B): <5 mg/dL (ref <5)

## 2015-06-29 LAB — POC OCCULT BLOOD, ED: Fecal Occult Bld: POSITIVE — AB

## 2015-06-29 LAB — GLUCOSE, CAPILLARY: Glucose-Capillary: 249 mg/dL — ABNORMAL HIGH (ref 65–99)

## 2015-06-29 LAB — ABO/RH: ABO/RH(D): A POS

## 2015-06-29 LAB — PREPARE RBC (CROSSMATCH)

## 2015-06-29 MED ORDER — ONDANSETRON HCL 4 MG/2ML IJ SOLN
4.0000 mg | Freq: Four times a day (QID) | INTRAMUSCULAR | Status: DC | PRN
  Administered 2015-07-05: 4 mg via INTRAVENOUS
  Filled 2015-06-29: qty 2

## 2015-06-29 MED ORDER — MUPIROCIN 2 % EX OINT
1.0000 "application " | TOPICAL_OINTMENT | Freq: Two times a day (BID) | CUTANEOUS | Status: DC
  Administered 2015-06-29 – 2015-07-04 (×9): 1 via NASAL
  Filled 2015-06-29 (×2): qty 22

## 2015-06-29 MED ORDER — ALBUTEROL SULFATE (2.5 MG/3ML) 0.083% IN NEBU: 2.5000 mg | INHALATION_SOLUTION | RESPIRATORY_TRACT | Status: DC | PRN

## 2015-06-29 MED ORDER — SODIUM CHLORIDE 0.9 % IV SOLN: Freq: Once | INTRAVENOUS | Status: DC

## 2015-06-29 MED ORDER — SODIUM CHLORIDE 0.9 % IV SOLN
10.0000 mL/h | Freq: Once | INTRAVENOUS | Status: AC
  Administered 2015-06-29: 10 mL/h via INTRAVENOUS

## 2015-06-29 MED ORDER — SODIUM CHLORIDE 0.9 % IV BOLUS (SEPSIS)
2000.0000 mL | Freq: Once | INTRAVENOUS | Status: AC
  Administered 2015-06-29: 2000 mL via INTRAVENOUS

## 2015-06-29 MED ORDER — SODIUM CHLORIDE 0.9 % IV SOLN
8.0000 mg/h | INTRAVENOUS | Status: DC
  Administered 2015-06-29 – 2015-07-02 (×6): 8 mg/h via INTRAVENOUS
  Filled 2015-06-29 (×16): qty 80

## 2015-06-29 MED ORDER — PANTOPRAZOLE SODIUM 40 MG IV SOLR: 40.0000 mg | Freq: Two times a day (BID) | INTRAVENOUS | Status: DC

## 2015-06-29 MED ORDER — INSULIN ASPART 100 UNIT/ML ~~LOC~~ SOLN
0.0000 [IU] | SUBCUTANEOUS | Status: DC
  Administered 2015-06-29: 3 [IU] via SUBCUTANEOUS

## 2015-06-29 MED ORDER — PANTOPRAZOLE SODIUM 40 MG IV SOLR
80.0000 mg | Freq: Once | INTRAVENOUS | Status: AC
Start: 2015-06-29 — End: 2015-06-29
  Administered 2015-06-29: 80 mg via INTRAVENOUS
  Filled 2015-06-29: qty 80

## 2015-06-29 MED ORDER — OCTREOTIDE LOAD VIA INFUSION
50.0000 ug | Freq: Once | INTRAVENOUS | Status: AC
  Administered 2015-06-29: 50 ug via INTRAVENOUS
  Filled 2015-06-29: qty 25

## 2015-06-29 MED ORDER — SODIUM CHLORIDE 0.9 % IV SOLN
80.0000 mg | Freq: Once | INTRAVENOUS | Status: AC
  Administered 2015-06-29: 80 mg via INTRAVENOUS
  Filled 2015-06-29: qty 80

## 2015-06-29 MED ORDER — DEXTROSE 5 % IV SOLN
1.0000 g | INTRAVENOUS | Status: AC
  Administered 2015-06-29 – 2015-07-03 (×5): 1 g via INTRAVENOUS
  Filled 2015-06-29 (×6): qty 10

## 2015-06-29 MED ORDER — VITAMIN K1 10 MG/ML IJ SOLN
10.0000 mg | Freq: Once | INTRAVENOUS | Status: AC
  Administered 2015-06-29: 10 mg via INTRAVENOUS
  Filled 2015-06-29 (×2): qty 1

## 2015-06-29 MED ORDER — VASOPRESSIN 20 UNIT/ML IV SOLN
0.4000 [IU]/min | INTRAVENOUS | Status: DC
  Administered 2015-06-29 – 2015-07-01 (×8): 0.4 [IU]/min via INTRAVENOUS
  Filled 2015-06-29 (×10): qty 5

## 2015-06-29 MED ORDER — SODIUM CHLORIDE 0.9 % IV SOLN
250.0000 mL | INTRAVENOUS | Status: DC | PRN
  Administered 2015-07-02: 250 mL via INTRAVENOUS

## 2015-06-29 MED ORDER — SODIUM CHLORIDE 0.9 % IV BOLUS (SEPSIS)
1000.0000 mL | Freq: Once | INTRAVENOUS | Status: AC
  Administered 2015-06-29: 1000 mL via INTRAVENOUS

## 2015-06-29 MED ORDER — SODIUM CHLORIDE 0.9 % IV SOLN
50.0000 ug/h | INTRAVENOUS | Status: DC
  Administered 2015-06-29 – 2015-07-01 (×5): 50 ug/h via INTRAVENOUS
  Filled 2015-06-29 (×11): qty 1

## 2015-06-29 MED ORDER — SODIUM CHLORIDE 0.9 % IV SOLN
INTRAVENOUS | Status: DC
  Administered 2015-06-29: 18:00:00 via INTRAVENOUS

## 2015-06-29 MED ORDER — CHLORHEXIDINE GLUCONATE CLOTH 2 % EX PADS
6.0000 | MEDICATED_PAD | Freq: Every day | CUTANEOUS | Status: AC
  Administered 2015-06-30 – 2015-07-04 (×5): 6 via TOPICAL

## 2015-06-29 NOTE — Consult Note (Signed)
[facility] Reason for consult:UGI bleeding in cirrhotic with know varices Referring Physician: ER no PCP  Derrick [name] is an 45 y.o. male.  HPI: patient was just hospitalized last week and sent home 5 days ago. He's a known cirrhotic who continues to drink. He had EGD by Dr. Paulita Fujita showing portal gastropathy of the significant nature and 2+ varices it had not been bleeding. No banding was performed. The patient went home with plans to not drink and was started on and row. Per his mother it's not clear if he's been taking it. He apparently has not had much alcohol since returning home but has had 'a couple of beers". He has just been laying in bed. According to his mother he had coffee ground emesis but no bright blood and pass melenic stools. His hemoglobin has dropped from 11.12/24 to 4.8. He is quite pale  Past Medical History  Diagnosis Date  . Hypertension   . ETOH abuse   . Cirrhosis ([place])   . GI bleed     Past Surgical History  Procedure Laterality Date  . Tonsillectomy    . Esophagogastroduodenoscopy (egd) with propofol N/A 06/23/2015    Procedure: ESOPHAGOGASTRODUODENOSCOPY (EGD) WITH PROPOFOL;  Surgeon: Arta Silence, MD;  Location: [facility];  Service: Endoscopy;  Laterality: N/A;    Family History  Problem Relation Age of Onset  . Hypertension Father     Social History:  reports that he has been smoking Cigarettes.  He has never used smokeless tobacco. He reports that he drinks alcohol. He reports that he uses illicit drugs (Marijuana).  Allergies: No Known Allergies  Medications; Prior to Admission medications   Medication Sig Start Date End Date Taking? Authorizing Provider  ciprofloxacin (CIPRO) 500 MG tablet Take 1 tablet (500 mg total) by mouth 2 (two) times daily. 06/24/15  Yes Geradine Girt, DO  furosemide (LASIX) 40 MG tablet Take 1 tablet (40 mg total) by mouth daily. 06/24/15  Yes Jessica U Vann, DO  pantoprazole (PROTONIX) 40 MG tablet  Take 1 tablet (40 mg total) by mouth daily. 06/24/15  Yes Geradine Girt, DO  propranolol (INDERAL) 10 MG tablet Take 1 tablet (10 mg total) by mouth 2 (two) times daily. 06/24/15  Yes Geradine Girt, DO  spironolactone (ALDACTONE) 100 MG tablet Take 1 tablet (100 mg total) by mouth daily. 06/24/15  Yes Geradine Girt, DO   . octreotide  50 mcg Intravenous Once  . [START ON 07/03/2015] pantoprazole (PROTONIX) IV  40 mg Intravenous Q12H   PRN Meds sodium chloride, albuterol, ondansetron (ZOFRAN) IV Results for orders placed or performed during the hospital encounter of 07/02/2015 (from the past 48 hour(s))  Prepare fresh frozen plasma     Status: None   Collection Time: 07/07/2015  1:34 PM  Result Value Ref Range   Unit Number [id number]    Blood Component Type LIQ PLASMA    Unit division 00    Status of Unit REL FROM [facility]    Unit tag comment VERBAL ORDERS PER DR YAO    Transfusion Status OK TO TRANSFUSE    Unit Number [id number]    Blood Component Type LIQ PLASMA    Unit division 00    Status of Unit REL FROM [facility]    Unit tag comment VERBAL ORDERS PER DR YAO    Transfusion Status OK TO TRANSFUSE   Prepare RBC     Status: None   Collection Time: 07/15/2015  1:44 PM  Result Value Ref Range   Order Confirmation ORDER PROCESSED BY BLOOD BANK   Type and screen     Status: None (Preliminary result)   Collection Time: 07/20/2015  1:44 PM  Result Value Ref Range   ABO/RH(D) A POS    Antibody Screen NEG    Sample Expiration 07/02/2015    Unit Number [id number]    Blood Component Type RBC LR PHER1    Unit division 00    Status of Unit ISSUED    Unit tag comment VERBAL ORDERS PER DR YAO    Transfusion Status OK TO TRANSFUSE    Crossmatch Result COMPATIBLE    Unit Number [id number]    Blood Component Type RBC LR PHER1    Unit division 00    Status of Unit REL FROM [facility]    Unit tag comment VERBAL ORDERS PER DR YAO    Transfusion Status OK TO TRANSFUSE    Crossmatch Result  COMPATIBLE    Unit Number [id number]    Blood Component Type RED CELLS,LR    Unit division 00    Status of Unit ISSUED    Transfusion Status OK TO TRANSFUSE    Crossmatch Result Compatible    Unit Number [id number]    Blood Component Type RED CELLS,LR    Unit division 00    Status of Unit ALLOCATED    Transfusion Status OK TO TRANSFUSE    Crossmatch Result Compatible    Unit Number [id number]    Blood Component Type RBC LR PHER2    Unit division 00    Status of Unit ALLOCATED    Transfusion Status OK TO TRANSFUSE    Crossmatch Result Compatible   ABO/Rh     Status: None   Collection Time: 07/03/2015  1:44 PM  Result Value Ref Range   ABO/RH(D) A POS   Ethanol     Status: None   Collection Time: 07/20/2015  1:53 PM  Result Value Ref Range   Alcohol, Ethyl (B) <5 <5 mg/dL    Comment:        LOWEST DETECTABLE LIMIT FOR SERUM ALCOHOL IS 5 mg/dL FOR MEDICAL PURPOSES ONLY REPEATED TO VERIFY   CBC with Differential     Status: Abnormal   Collection Time: 07/17/2015  1:54 PM  Result Value Ref Range   WBC 18.8 (H) 4.0 - 10.5 K/uL   RBC 1.35 (L) 4.22 - 5.81 MIL/uL   Hemoglobin 4.8 (LL) 13.0 - 17.0 g/dL    Comment: REPEATED TO VERIFY CRITICAL RESULT CALLED TO, READ BACK BY AND VERIFIED WITH: J WATTS,RN AT 1419 07/17/2015 BY K BARR    HCT 14.3 (L) 39.0 - 52.0 %   MCV 105.9 (H) 78.0 - 100.0 fL   MCH 35.6 (H) 26.0 - 34.0 pg   MCHC 33.6 30.0 - 36.0 g/dL   RDW 16.0 (H) 11.5 - 15.5 %   Platelets 168 150 - 400 K/uL   Neutrophils Relative % 70 %   Neutro Abs 13.1 (H) 1.7 - 7.7 K/uL   Lymphocytes Relative 14 %   Lymphs Abs 2.6 0.7 - 4.0 K/uL   Monocytes Relative 16 %   Monocytes Absolute 3.0 (H) 0.1 - 1.0 K/uL   Eosinophils Relative 0 %   Eosinophils Absolute 0.0 0.0 - 0.7 K/uL   Basophils Relative 0 %   Basophils Absolute 0.0 0.0 - 0.1 K/uL  Comprehensive metabolic panel     Status: Abnormal   Collection Time: 07/26/2015  1:54 PM  Result  Value Ref Range   Sodium 134 (L) 135 -  145 mmol/L   Potassium 4.0 3.5 - 5.1 mmol/L   Chloride 83 (L) 101 - 111 mmol/L   CO2 16 (L) 22 - 32 mmol/L   Glucose, Bld 198 (H) 65 - 99 mg/dL   BUN 98 (H) 6 - 20 mg/dL   Creatinine, Ser 6.02 (H) 0.61 - 1.24 mg/dL   Calcium 7.5 (L) 8.9 - 10.3 mg/dL   Total Protein 4.2 (L) 6.5 - 8.1 g/dL   Albumin 1.7 (L) 3.5 - 5.0 g/dL   AST <5 (L) 15 - 41 U/L    Comment: REPEATED TO VERIFY   ALT <5 (L) 17 - 63 U/L    Comment: REPEATED TO VERIFY   Alkaline Phosphatase 52 38 - 126 U/L   Total Bilirubin 4.6 (H) 0.3 - 1.2 mg/dL   GFR calc non Af Amer 10 (L) >60 mL/min   GFR calc Af Amer 12 (L) >60 mL/min    Comment: (NOTE) The eGFR has been calculated using the CKD EPI equation. This calculation has not been validated in all clinical situations. eGFR's persistently <60 mL/min signify possible Chronic Kidney Disease.    Anion gap 35 (H) 5 - 15  Protime-INR     Status: Abnormal   Collection Time: 07/25/2015  1:54 PM  Result Value Ref Range   Prothrombin Time 38.6 (H) 11.6 - 15.2 seconds   INR 4.08 (H) 0.00 - 1.49  POC occult blood, ED Provider will collect     Status: Abnormal   Collection Time: 07/03/2015  2:31 PM  Result Value Ref Range   Fecal Occult Bld POSITIVE (A) NEGATIVE    Dg Chest Portable 1 View  07/10/2015  CLINICAL DATA:  Hypotension EXAM: PORTABLE CHEST 1 VIEW COMPARISON:  Chest CT June 21, 2015 FINDINGS: There is no edema or consolidation. Heart size and pulmonary vascularity are normal. No adenopathy. No bone lesions. IMPRESSION: No edema or consolidation. Electronically Signed   By: Lowella Grip III M.D.   On: 07/27/2015 14:03               Blood pressure 102/59, pulse 113, temperature 98.7 F (37.1 C), temperature source Oral, resp. rate 23, height _0  (1.702 m), weight 75.297 kg (166 lb), SpO2 100 %.  Physical exam:   General--Pale tattooed white male. No distress. ENT-- icteric, mucous membranes dry  Heart-- tachycardic Lungs-- clear Abdomen-- soft and  nontender except tender overdeliver. Liver appears to be enlarged Psych-- oriented toward year in place. Seems slightly confused.   Assessment: 1. Upper G.I. bleed. He has had coffee ground emesis and this could well be from portal gastropathy but it is possibly could have bled from varices. He will need to be scoped again. 2. Acute renal failure. Possibly could be developing hepatorenal syndrome. 3. Cirrhosis with varices and portal gastropathy. Patient continuing to drink.  Plan: 1. He will be admitted to critical care medicine in the central line will be placed. 2. Initially he will be treated with octreotide infusion IV Protonix. 3. We have discussed with mother and will plan on EGD with banning of esophageal varices at 7:30 AM tomorrow with MAC. This will give some time to transfuse. Should he bleed heavily tonight he will be ready to go ahead and have EGD with esophageal banding tonight. This is all been discussed with the patient and with his mother.   Aster Screws JR,Machell Wirthlin L 07/21/2015, 4:14 PM   This note was created using  voice recognition software and minor errors may Have occurred unintentionally. Pager: [phone] If no answer or after hours call [phone]

## 2015-06-29 NOTE — Procedures (Signed)
Cordis Placement Procedure Note TOR TSUDA [id number] 1971/03/28  Procedure: Insertion of Central Venous Catheter Indications: Assessment of intravascular volume, Drug and/or fluid administration and Frequent blood sampling  Procedure Details Consent: Risks of procedure as well as the alternatives and risks of each were explained to the (patient/caregiver).  Consent for procedure obtained. Time Out: Verified patient identification, verified procedure, site/side was marked, verified correct patient position, special equipment/implants available, medications/allergies/relevent history reviewed, required imaging and test results available.  Performed  Maximum sterile technique was used including antiseptics, cap, gloves, gown, hand hygiene, mask and sheet. Skin prep: Chlorhexidine; local anesthetic administered A Cordis catheter was placed in the right internal jugular vein using the Seldinger technique. Ultrasound guidance used.Yes.   Catheter placed to 10 cm. Blood aspirated via all 3 ports and then flushed x 3. Line sutured x 2 and dressing applied.  Central venous catheter placed under sterile conditions into main port of Cordis. Placed to 15cm.   Evaluation Blood flow good Complications: No apparent complications Patient did tolerate procedure well. Chest X-ray ordered to verify placement.  CXR: pending.  Joneen Roach, AGACNP-BC [facility] Pager [phone] or [phone]  07/05/2015 4:37 PM   [place] cordis for massive Tx  Needs Triple lumen through for now  toleratred  Mcarthur Rossetti. Tyson Alias, MD, FACP Pgr: [phone] [facility]]

## 2015-06-29 NOTE — ED Provider Notes (Signed)
CSN: [id number]     Arrival date & time 07/27/2015  1315 History   First MD Initiated Contact with Patient 07/05/2015 1319     Chief Complaint  Patient presents with  . GI Bleeding    rectal and vomiting   HPI   45 year old male presents today with GI bleed. EMS was called out by family as they noted patient had been lethargic, weak, minimally responsive. They report that he was recently admitted to the hospital for acute GI bleed, was seen by Dr. Dulce Sellar with colonoscopy and endoscopy. They report since then he's been lethargic laying in bed, coffee-ground emesis, no bright red per vomit or stool.  At this time my evaluation patient slow to respond, difficult exam due to patients condition / willingness to provide information   Past Medical History  Diagnosis Date  . Hypertension   . ETOH abuse   . Cirrhosis ([facility])   . GI bleed    Past Surgical History  Procedure Laterality Date  . Tonsillectomy    . Esophagogastroduodenoscopy (egd) with propofol N/A 06/23/2015    Procedure: ESOPHAGOGASTRODUODENOSCOPY (EGD) WITH PROPOFOL;  Surgeon: Willis Modena, MD;  Location: [facility];  Service: Endoscopy;  Laterality: N/A;   Family History  Problem Relation Age of Onset  . Hypertension Father    Social History  Substance Use Topics  . Smoking status: Current Every Day Smoker    Types: Cigarettes  . Smokeless tobacco: Never Used  . Alcohol Use: Yes    Review of Systems  All other systems reviewed and are negative.     Allergies  Review of patient's allergies indicates no known allergies.  Home Medications   Prior to Admission medications   Medication Sig Start Date End Date Taking? Authorizing Provider  ciprofloxacin (CIPRO) 500 MG tablet Take 1 tablet (500 mg total) by mouth 2 (two) times daily. 06/24/15  Yes Joseph Art, DO  furosemide (LASIX) 40 MG tablet Take 1 tablet (40 mg total) by mouth daily. 06/24/15  Yes Jessica U Vann, DO  pantoprazole (PROTONIX) 40 MG tablet Take 1  tablet (40 mg total) by mouth daily. 06/24/15  Yes Joseph Art, DO  propranolol (INDERAL) 10 MG tablet Take 1 tablet (10 mg total) by mouth 2 (two) times daily. 06/24/15  Yes Joseph Art, DO  spironolactone (ALDACTONE) 100 MG tablet Take 1 tablet (100 mg total) by mouth daily. 06/24/15  Yes Jessica U Vann, DO   BP 126/84 mmHg  Pulse 101  Temp(Src) 98.3 F (36.8 C) (Oral)  Resp 25  Ht  (1.702 m)  Wt 75.297 kg  BMI 25.99 kg/m2  SpO2 96%   Physical Exam  Constitutional: He is oriented to person, place, and time. He appears well-developed and well-nourished.  Patient jaundice  HENT:  Head: Normocephalic and atraumatic.  Eyes: Conjunctivae are normal. Pupils are equal, round, and reactive to light. Right eye exhibits no discharge. Left eye exhibits no discharge. No scleral icterus.  Neck: Normal range of motion. No JVD present. No tracheal deviation present.  Pulmonary/Chest: Effort normal and breath sounds normal. No stridor.  Abdominal: Soft. He exhibits no distension and no mass. There is no tenderness. There is no rebound and no guarding.  Neurological: He is alert and oriented to person, place, and time. Coordination normal.  Skin: Skin is warm and dry.  Psychiatric: He has a normal mood and affect. His behavior is normal. Judgment and thought content normal.  Nursing note and vitals reviewed.  ED Course  Procedures (including critical care time)  CRITICAL CARE Performed by: Thermon Leyland   Total critical care time: 35 minutes  Critical care time was exclusive of separately billable procedures and treating other patients.  Critical care was necessary to treat or prevent imminent or life-threatening deterioration.  Critical care was time spent personally by me on the following activities: development of treatment plan with patient and/or surrogate as well as nursing, discussions with consultants, evaluation of patient's response to treatment, examination of  patient, obtaining history from patient or surrogate, ordering and performing treatments and interventions, ordering and review of laboratory studies, ordering and review of radiographic studies, pulse oximetry and re-evaluation of patient's condition.  Labs Review Labs Reviewed  CBC WITH DIFFERENTIAL/PLATELET - Abnormal; Notable for the following:    WBC 18.8 (*)    RBC 1.35 (*)    Hemoglobin 4.8 (*)    HCT 14.3 (*)    MCV 105.9 (*)    MCH 35.6 (*)    RDW 16.0 (*)    Neutro Abs 13.1 (*)    Monocytes Absolute 3.0 (*)    All other components within normal limits  COMPREHENSIVE METABOLIC PANEL - Abnormal; Notable for the following:    Sodium 134 (*)    Chloride 83 (*)    CO2 16 (*)    Glucose, Bld 198 (*)    BUN 98 (*)    Creatinine, Ser 6.02 (*)    Calcium 7.5 (*)    Total Protein 4.2 (*)    Albumin 1.7 (*)    AST <5 (*)    ALT <5 (*)    Total Bilirubin 4.6 (*)    GFR calc non Af Amer 10 (*)    GFR calc Af Amer 12 (*)    Anion gap 35 (*)    All other components within normal limits  PROTIME-INR - Abnormal; Notable for the following:    Prothrombin Time 38.6 (*)    INR 4.08 (*)    All other components within normal limits  GLUCOSE, CAPILLARY - Abnormal; Notable for the following:    Glucose-Capillary 249 (*)    All other components within normal limits  POC OCCULT BLOOD, ED - Abnormal; Notable for the following:    Fecal Occult Bld POSITIVE (*)    All other components within normal limits  URINE CULTURE  ETHANOL  URINALYSIS, ROUTINE W REFLEX MICROSCOPIC (NOT AT [facility])  CBC  CBC  PROTIME-INR  SODIUM, URINE, RANDOM  OSMOLALITY, URINE  BASIC METABOLIC PANEL  APTT  PROTIME-INR  CBC  POC OCCULT BLOOD, ED  I-STAT CG4 LACTIC ACID, ED  I-STAT CG4 LACTIC ACID, ED  PREPARE RBC (CROSSMATCH)  TYPE AND SCREEN  PREPARE FRESH FROZEN PLASMA  ABO/RH  PREPARE FRESH FROZEN PLASMA    Imaging Review Dg Chest Port 1 View  Jul 28, 2015  CLINICAL DATA:  Gastrointestinal tract  bleeding, placement of central venous line EXAM: PORTABLE CHEST 1 VIEW COMPARISON:  Chest x-ray of 2015/07/28 FINDINGS: A right IJ central venous line is present with the tip overlying the mid SVC. No pneumothorax is seen. The lungs are not well aerated with volume loss bilaterally. The heart is within normal limits in size. No bony abnormality is seen. IMPRESSION: Right IJ central venous line tip overlies the mid SVC. No pneumothorax pre Electronically Signed   By: Dwyane Dee M.D.   On: 07/28/15 16:55   Dg Chest Portable 1 View  07-28-2015  CLINICAL DATA:  Hypotension EXAM: PORTABLE CHEST  1 VIEW COMPARISON:  Chest CT June 21, 2015 FINDINGS: There is no edema or consolidation. Heart size and pulmonary vascularity are normal. No adenopathy. No bone lesions. IMPRESSION: No edema or consolidation. Electronically Signed   By: Bretta Bang III M.D.   On: 07/02/2015 14:03   I have personally reviewed and evaluated these images and lab results as part of my medical decision-making.   EKG Interpretation   Date/Time:  Wednesday June 29 2015 13:30:06 EST Ventricular Rate:  112 PR Interval:  140 QRS Duration: 96 QT Interval:  380 QTC Calculation: 519 R Axis:   84 Text Interpretation:  Sinus tachycardia Minimal ST depression, lateral  leads Prolonged QT interval tachycardia new  Confirmed by YAO  MD, DAVID  ([id number]) on 07/20/2015 2:02:41 PM      MDM   Final diagnoses:  GIB (gastrointestinal bleeding)    Labs:Fecal occult positive, hemoglobin 4.8,  creatinine 6.02, bilirubin 4.6, PTT 38.6 INR 4.08  Imaging: DG chest 1 view  Consults: Intensivist, gastroenterology  Therapeutics: Packed red blood cells, normal saline  Discharge Meds:   Assessment/Plan: 45 year old male presents today with GI bleeding. Patient has no emesis or frank blood while here in the ED. Patient had dark stool around his legs, Hemoccult-positive rectal exam. Most recent endoscopy shows esophageal varices.  Patient was immediately started on normal saline, emergent blood release, he was hypertensive while here in the ED slightly improved with packed red blood cells and normal saline. Patient maintained adequate mental status while here in the ED. Gastroenterology was consult at, intensivist consultation by Dr. Silverio Lay who shared in patient care. Patient was personally evaluated by both gastroenterology and intensivist here in the ED and transferred accordingly.        Eyvonne Mechanic, PA-C 07/20/2015 2023  Richardean Canal, MD 07/18/2015 0700  Richardean Canal, MD 07/10/2015 0730

## 2015-06-29 NOTE — ED Notes (Signed)
Pt arrives from home via [facility] c/o coffee ground emesis, and tarry black stools x 2 days. Pt had endoscopy 2 days ago. Pt appears pale.

## 2015-06-29 NOTE — Consult Note (Signed)
PULMONARY / CRITICAL CARE MEDICINE   Name: Derrick [name] MRN: [id number] DOB: 13-Aug-1970    ADMISSION DATE:  07/21/2015 CONSULTATION DATE:  3/1  REFERRING MD:  EDP  CHIEF COMPLAINT:  AMS  HISTORY OF PRESENT ILLNESS:   45 year old male with PMH as below, which includes ETOH abuse, smoker (1ppd), hypertension, and recent admission for GI bleed. He was admitted from 2/21 > 2/24 with coffee ground emesis x 2-3 days and melena x 1 week. 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  He returned 3/1 after being found by family lethargic, weak with coffee-ground emesis.  No red/bloody vomit or stool per family.  Still actively drinking, usually .  Last drink was 2/28.    PAST MEDICAL HISTORY :  He  has a past medical history of Hypertension; ETOH abuse; Cirrhosis (HCC); and GI bleed.  PAST SURGICAL HISTORY: He  has past surgical history that includes Tonsillectomy and Esophagogastroduodenoscopy (egd) with propofol (N/A, 06/23/2015).  No Known Allergies  No current facility-administered medications on file prior to encounter.   Current Outpatient Prescriptions on File Prior to Encounter  Medication Sig  . ciprofloxacin (CIPRO) 500 MG tablet Take 1 tablet (500 mg total) by mouth 2 (two) times daily.  . furosemide (LASIX) 40 MG tablet Take 1 tablet (40 mg total) by mouth daily.  . pantoprazole (PROTONIX) 40 MG tablet Take 1 tablet (40 mg total) by mouth daily.  . propranolol (INDERAL) 10 MG tablet Take 1 tablet (10 mg total) by mouth 2 (two) times daily.  Marland Kitchen spironolactone (ALDACTONE) 100 MG tablet Take 1 tablet (100 mg total) by mouth daily.    FAMILY HISTORY:  His indicated that his mother is alive. He indicated that his father is alive.   SOCIAL HISTORY: He  reports that he has been smoking Cigarettes.  He has never used smokeless tobacco.  He reports that he drinks alcohol. He reports that he uses illicit drugs (Marijuana).  REVIEW OF SYSTEMS:   Unable r/t lethargy - obtained from records as per HPI.    SUBJECTIVE:    VITAL SIGNS: BP 102/59 mmHg  Pulse 113  Temp(Src) 98.7 F (37.1 C) (Oral)  Resp 23  Ht  (1.702 m)  Wt 75.297 kg (166 lb)  BMI 25.99 kg/m2  SpO2 100%  HEMODYNAMICS:    VENTILATOR SETTINGS:    INTAKE / OUTPUT:    PHYSICAL EXAMINATION: General:  Chronically ill appearing male, NAD  Neuro:  Awake, alert, oriented, intermittently confused  HEENT:  Mm dry, pale Cardiovascular:  s1s2 rrr, mild tachy Lungs:  resps even non labored on [place], diminished bases, otherwise clear  Abdomen:  Round, distended, ascites, mildly tender diffusely, hypoactive bs  Musculoskeletal:  Pale, cool, dry, no edema   LABS:  BMET  Recent Labs Lab 06/23/15 0326 06/24/15 0505 07/12/2015 1354  NA 138 136 134*  K 3.9 3.2* 4.0  CL 103 97* 83*  CO2 25 30 16*  BUN 14 16 98*  CREATININE 0.65 0.82 6.02*  GLUCOSE 236* 173* 198*    Electrolytes  Recent Labs Lab 06/23/15 0326 06/24/15 0505 07/03/2015 1354  CALCIUM 6.7* 7.1* 7.5*    CBC  Recent Labs Lab 06/23/15 0326 06/24/15 0505 07/28/2015 1354  WBC 4.0 7.0 18.8*  HGB 9.8* 11.1* 4.8*  HCT 29.2* 33.3* 14.3*  PLT 71* 74* 168  Coag's  Recent Labs Lab 07/24/2015 1354  INR 4.08*    Sepsis Markers No results for input(s): LATICACIDVEN, PROCALCITON, O2SATVEN in the last 168 hours.  ABG No results for input(s): PHART, PCO2ART, PO2ART in the last 168 hours.  Liver Enzymes  Recent Labs Lab 07/26/2015 1354  AST <5*  ALT <5*  ALKPHOS 52  BILITOT 4.6*  ALBUMIN 1.7*    Cardiac Enzymes No results for input(s): TROPONINI, PROBNP in the last 168 hours.  Glucose  Recent Labs Lab 06/23/15 1339 06/23/15 1704 06/23/15 2205 06/24/15 0810  GLUCAP 188* 262* 137* 132*    Imaging Dg Chest Portable 1 View  07/26/2015  CLINICAL DATA:   Hypotension EXAM: PORTABLE CHEST 1 VIEW COMPARISON:  Chest CT June 21, 2015 FINDINGS: There is no edema or consolidation. Heart size and pulmonary vascularity are normal. No adenopathy. No bone lesions. IMPRESSION: No edema or consolidation. Electronically Signed   By: Bretta Bang III M.D.   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  Esophageal varices noted.   EGD 3/2>>>  CULTURES: BC x 2  3/1>>>  ANTIBIOTICS: Ceftriaxone 3/1>>>  SIGNIFICANT EVENTS:   LINES/TUBES: R IJ Cortis 3/1>>>  DISCUSSION: 45yo male with hx ETOH cirrhosis, ongoing ETOH use with recurrent GI bleed and AKI and ??hepatorenal.    ASSESSMENT / PLAN:  GASTROINTESTINAL GI bleed  Esophageal varices  ETOH cirrhosis  P:   Vasopressin 0.4 u/hr gtt for GI bleed  GI to see  Plan EGD in am (0730)  Octreotide, protonix gtt  NPO  Empiric SBP coverage as below  F/u LFT's   PULMONARY No active issue  P:   Monitor airway protection closely with UGI bleeding  Supplemental O2 as needed   CARDIOVASCULAR Hypotension - mild.  Improving with volume P:  Volume resuscitation Trend troponin  Place CVL    RENAL AKI - ?hepatorenal v AKI from hypovolemic/hemorrhage  P:   F/u chem  Volume resuscitation as above    HEMATOLOGIC Blood loss anemia  Coagulopathy - r/t cirrhosis  P:  4 units FFP now  Vit k 10 units IV now  PRBC - 2 units now  q4h CBC F/u INR in am   INFECTIOUS At risk SBP with GI bleed, cirrhosis  P:   Empiric ceftriaxone   ENDOCRINE Hyperglycemia   P:   Monitor glucose on chem  SSI if glucose remains >180  NEUROLOGIC AMS  ETOH abuse  P:   CIWA protocol    FAMILY  - Updates:  Family (mother and aunt) updated at length at bedside 3/1  Discussed with Dr Randa Evens (GI) at bedside.    Dirk Dress, NP 07/11/2015  4:15 PM Pager: [phone] or [phone]   STAFF NOTE: I, Rory Percy, MD FACP have  personally reviewed patient's available data, including medical history, events of note, physical examination and test results as part of my evaluation. I have discussed with resident/NP and other care providers such as pharmacist, RN and RRT. In addition, I personally evaluated patient and elicited key findings of: awake, no distress, abdo distended, non tender, concern is portal gastropathy again as source, appreciate GI in room and there recs for blood and hold scope for now, will likley need egd in am unles worsen, PRBC stat, cbc q4h, place cordis, line, octreotide, ppi drip, consider esophageal vaso dosing, ffp x 4 untis, copags to follow, vit K , concern is renal failure, atn vs hepatorenal, get  urine na, volume, prbc, bmet q8h, renal US may be needed, empiric prevention sbp, avoid para for now may worsen status, updated pt and family in room The patient is critically ill with multiple organ systems failure and requires high complexity decision making for assessment and support, frequent evaluation and titration of therapies, application of advanced monitoring technologies and extensive interpretation of multiple databases.   Critical Care Time devoted to patient care services described in this note is35 Minutes. This time reflects time of care of this signee: Rory Percy, MD FACP. This critical care time does not reflect procedure time, or teaching time or supervisory time of PA/NP/Med student/Med Resident etc but could involve care discussion time. Rest per NP/medical resident whose note is outlined above and that I agree with   Mcarthur Rossetti. Tyson Alias, MD, FACP Pgr: [phone] [facility] 07/05/2015 5:27 PM

## 2015-06-30 ENCOUNTER — Encounter (HOSPITAL_COMMUNITY): Payer: Self-pay | Admitting: Gastroenterology

## 2015-06-30 ENCOUNTER — Encounter (HOSPITAL_COMMUNITY): Admission: EM | Disposition: E | Payer: Self-pay | Source: Home / Self Care | Attending: Internal Medicine

## 2015-06-30 DIAGNOSIS — N179 Acute kidney failure, unspecified: Secondary | ICD-10-CM

## 2015-06-30 DIAGNOSIS — K7031 Alcoholic cirrhosis of liver with ascites: Secondary | ICD-10-CM

## 2015-06-30 HISTORY — PX: ESOPHAGOGASTRODUODENOSCOPY: SHX5428

## 2015-06-30 LAB — GLUCOSE, CAPILLARY
GLUCOSE-CAPILLARY: 348 mg/dL — AB (ref 65–99)
GLUCOSE-CAPILLARY: 337 mg/dL — AB (ref 65–99)
GLUCOSE-CAPILLARY: 364 mg/dL — AB (ref 65–99)
GLUCOSE-CAPILLARY: 304 mg/dL — AB (ref 65–99)
GLUCOSE-CAPILLARY: 225 mg/dL — AB (ref 65–99)
GLUCOSE-CAPILLARY: 176 mg/dL — AB (ref 65–99)
Glucose-Capillary: 121 mg/dL — ABNORMAL HIGH (ref 65–99)
Glucose-Capillary: 147 mg/dL — ABNORMAL HIGH (ref 65–99)
Glucose-Capillary: 153 mg/dL — ABNORMAL HIGH (ref 65–99)
Glucose-Capillary: 178 mg/dL — ABNORMAL HIGH (ref 65–99)
Glucose-Capillary: 259 mg/dL — ABNORMAL HIGH (ref 65–99)
Glucose-Capillary: 260 mg/dL — ABNORMAL HIGH (ref 65–99)
Glucose-Capillary: 327 mg/dL — ABNORMAL HIGH (ref 65–99)
Glucose-Capillary: 361 mg/dL — ABNORMAL HIGH (ref 65–99)
Glucose-Capillary: 365 mg/dL — ABNORMAL HIGH (ref 65–99)

## 2015-06-30 LAB — CBC
HCT: 20.9 % — ABNORMAL LOW (ref 39.0–52.0)
HCT: 22.1 % — ABNORMAL LOW (ref 39.0–52.0)
HEMATOCRIT: 17.9 % — AB (ref 39.0–52.0)
HEMOGLOBIN: 6.3 g/dL — AB (ref 13.0–17.0)
Hemoglobin: 7.5 g/dL — ABNORMAL LOW (ref 13.0–17.0)
Hemoglobin: 7.7 g/dL — ABNORMAL LOW (ref 13.0–17.0)
MCH: 33.7 pg (ref 26.0–34.0)
MCH: 33.6 pg (ref 26.0–34.0)
MCH: 32.1 pg (ref 26.0–34.0)
MCHC: 35.2 g/dL (ref 30.0–36.0)
MCHC: 35.9 g/dL (ref 30.0–36.0)
MCHC: 34.8 g/dL (ref 30.0–36.0)
MCV: 95.7 fL (ref 78.0–100.0)
MCV: 93.7 fL (ref 78.0–100.0)
MCV: 92.1 fL (ref 78.0–100.0)
PLATELETS: 64 10*3/uL — AB (ref 150–400)
PLATELETS: 60 10*3/uL — AB (ref 150–400)
Platelets: 77 10*3/uL — ABNORMAL LOW (ref 150–400)
RBC: 1.87 MIL/uL — AB (ref 4.22–5.81)
RBC: 2.23 MIL/uL — ABNORMAL LOW (ref 4.22–5.81)
RBC: 2.4 MIL/uL — AB (ref 4.22–5.81)
RDW: 19 % — ABNORMAL HIGH (ref 11.5–15.5)
RDW: 17.8 % — AB (ref 11.5–15.5)
RDW: 18.2 % — ABNORMAL HIGH (ref 11.5–15.5)
WBC: 15.8 10*3/uL — AB (ref 4.0–10.5)
WBC: 15.2 10*3/uL — AB (ref 4.0–10.5)
WBC: 15.1 10*3/uL — AB (ref 4.0–10.5)

## 2015-06-30 LAB — BASIC METABOLIC PANEL
ANION GAP: 12 (ref 5–15)
ANION GAP: 11 (ref 5–15)
Anion gap: 17 — ABNORMAL HIGH (ref 5–15)
BUN: 101 mg/dL — AB (ref 6–20)
BUN: 92 mg/dL — ABNORMAL HIGH (ref 6–20)
BUN: 80 mg/dL — ABNORMAL HIGH (ref 6–20)
CALCIUM: 6.5 mg/dL — AB (ref 8.9–10.3)
CALCIUM: 6.8 mg/dL — AB (ref 8.9–10.3)
CHLORIDE: 94 mmol/L — AB (ref 101–111)
CHLORIDE: 94 mmol/L — AB (ref 101–111)
CO2: 23 mmol/L (ref 22–32)
CO2: 26 mmol/L (ref 22–32)
CO2: 27 mmol/L (ref 22–32)
Calcium: 6.9 mg/dL — ABNORMAL LOW (ref 8.9–10.3)
Chloride: 94 mmol/L — ABNORMAL LOW (ref 101–111)
Creatinine, Ser: 4.04 mg/dL — ABNORMAL HIGH (ref 0.61–1.24)
Creatinine, Ser: 3.03 mg/dL — ABNORMAL HIGH (ref 0.61–1.24)
Creatinine, Ser: 2.09 mg/dL — ABNORMAL HIGH (ref 0.61–1.24)
GFR calc Af Amer: 20 mL/min — ABNORMAL LOW (ref >60)
GFR calc non Af Amer: 17 mL/min — ABNORMAL LOW (ref >60)
GFR calc non Af Amer: 24 mL/min — ABNORMAL LOW (ref >60)
GFR calc non Af Amer: 37 mL/min — ABNORMAL LOW (ref >60)
GFR, EST AFRICAN AMERICAN: 28 mL/min — AB (ref >60)
GFR, EST AFRICAN AMERICAN: 43 mL/min — AB (ref >60)
GLUCOSE: 398 mg/dL — AB (ref 65–99)
GLUCOSE: 464 mg/dL — AB (ref 65–99)
Glucose, Bld: 299 mg/dL — ABNORMAL HIGH (ref 65–99)
POTASSIUM: 2.8 mmol/L — AB (ref 3.5–5.1)
POTASSIUM: 2.6 mmol/L — AB (ref 3.5–5.1)
POTASSIUM: 2.8 mmol/L — AB (ref 3.5–5.1)
Sodium: 134 mmol/L — ABNORMAL LOW (ref 135–145)
Sodium: 132 mmol/L — ABNORMAL LOW (ref 135–145)
Sodium: 132 mmol/L — ABNORMAL LOW (ref 135–145)

## 2015-06-30 LAB — URINALYSIS, ROUTINE W REFLEX MICROSCOPIC
Bilirubin Urine: NEGATIVE
Glucose, UA: 250 mg/dL — AB
Ketones, ur: NEGATIVE mg/dL
Leukocytes, UA: NEGATIVE
Nitrite: NEGATIVE
Protein, ur: NEGATIVE mg/dL
Specific Gravity, Urine: 1.013 (ref 1.005–1.030)
pH: 6 (ref 5.0–8.0)

## 2015-06-30 LAB — OSMOLALITY, URINE: Osmolality, Ur: 433 mOsm/kg (ref 300–900)

## 2015-06-30 LAB — URINE MICROSCOPIC-ADD ON: WBC, UA: NONE SEEN WBC/hpf (ref 0–5)

## 2015-06-30 LAB — PROTIME-INR
INR: 2.51 — ABNORMAL HIGH (ref 0.00–1.49)
INR: 2.38 — AB (ref 0.00–1.49)
PROTHROMBIN TIME: 25.7 s — AB (ref 11.6–15.2)
Prothrombin Time: 26.8 seconds — ABNORMAL HIGH (ref 11.6–15.2)

## 2015-06-30 LAB — AMMONIA: AMMONIA: 75 umol/L — AB (ref 9–35)

## 2015-06-30 LAB — SODIUM, URINE, RANDOM: SODIUM UR: 59 mmol/L

## 2015-06-30 LAB — APTT: APTT: 36 s (ref 24–37)

## 2015-06-30 LAB — PREPARE RBC (CROSSMATCH)

## 2015-06-30 SURGERY — EGD (ESOPHAGOGASTRODUODENOSCOPY)
Anesthesia: Moderate Sedation

## 2015-06-30 MED ORDER — CETYLPYRIDINIUM CHLORIDE 0.05 % MT LIQD
7.0000 mL | Freq: Two times a day (BID) | OROMUCOSAL | Status: DC
  Administered 2015-06-30 – 2015-07-03 (×7): 7 mL via OROMUCOSAL

## 2015-06-30 MED ORDER — INSULIN ASPART 100 UNIT/ML ~~LOC~~ SOLN
0.0000 [IU] | SUBCUTANEOUS | Status: DC
  Administered 2015-07-01 (×3): 8 [IU] via SUBCUTANEOUS
  Administered 2015-07-01 (×3): 5 [IU] via SUBCUTANEOUS
  Administered 2015-07-02: 2 [IU] via SUBCUTANEOUS
  Administered 2015-07-02: 5 [IU] via SUBCUTANEOUS
  Administered 2015-07-02 (×3): 2 [IU] via SUBCUTANEOUS
  Administered 2015-07-02: 3 [IU] via SUBCUTANEOUS
  Administered 2015-07-03 (×2): 2 [IU] via SUBCUTANEOUS
  Administered 2015-07-03: 3 [IU] via SUBCUTANEOUS
  Administered 2015-07-03: 2 [IU] via SUBCUTANEOUS
  Administered 2015-07-03: 3 [IU] via SUBCUTANEOUS
  Administered 2015-07-03 – 2015-07-04 (×3): 2 [IU] via SUBCUTANEOUS
  Administered 2015-07-04 (×2): 3 [IU] via SUBCUTANEOUS
  Administered 2015-07-04 – 2015-07-05 (×4): 2 [IU] via SUBCUTANEOUS

## 2015-06-30 MED ORDER — MIDAZOLAM HCL 5 MG/ML IJ SOLN
INTRAMUSCULAR | Status: AC
  Filled 2015-06-30: qty 3

## 2015-06-30 MED ORDER — POTASSIUM CHLORIDE 10 MEQ/50ML IV SOLN
10.0000 meq | INTRAVENOUS | Status: AC
  Administered 2015-06-30 (×6): 10 meq via INTRAVENOUS
  Filled 2015-06-30 (×5): qty 50

## 2015-06-30 MED ORDER — LORAZEPAM 2 MG/ML IJ SOLN
0.5000 mg | INTRAMUSCULAR | Status: DC | PRN
  Administered 2015-06-30 – 2015-07-01 (×3): 1 mg via INTRAVENOUS
  Filled 2015-06-30 (×3): qty 1

## 2015-06-30 MED ORDER — FENTANYL CITRATE (PF) 100 MCG/2ML IJ SOLN
INTRAMUSCULAR | Status: DC | PRN
  Administered 2015-06-30 (×2): 25 ug via INTRAVENOUS

## 2015-06-30 MED ORDER — FENTANYL CITRATE (PF) 100 MCG/2ML IJ SOLN
INTRAMUSCULAR | Status: AC
  Filled 2015-06-30: qty 4

## 2015-06-30 MED ORDER — POTASSIUM CHLORIDE IN NACL 20-0.45 MEQ/L-% IV SOLN
INTRAVENOUS | Status: DC
  Administered 2015-06-30 – 2015-07-01 (×2): via INTRAVENOUS
  Filled 2015-06-30 (×4): qty 1000

## 2015-06-30 MED ORDER — MIDAZOLAM HCL 10 MG/2ML IJ SOLN
INTRAMUSCULAR | Status: DC | PRN
  Administered 2015-06-30: 1 mg via INTRAVENOUS
  Administered 2015-06-30: 2 mg via INTRAVENOUS
  Administered 2015-06-30 (×2): 1 mg via INTRAVENOUS

## 2015-06-30 MED ORDER — POTASSIUM CHLORIDE 10 MEQ/100ML IV SOLN
10.0000 meq | INTRAVENOUS | Status: DC
  Filled 2015-06-30: qty 100

## 2015-06-30 MED ORDER — SODIUM CHLORIDE 0.9 % IV SOLN
INTRAVENOUS | Status: DC
  Administered 2015-06-30: 3 [IU]/h via INTRAVENOUS
  Filled 2015-06-30: qty 2.5

## 2015-06-30 MED ORDER — POTASSIUM CHLORIDE 10 MEQ/50ML IV SOLN
10.0000 meq | INTRAVENOUS | Status: AC
Start: 2015-06-30 — End: 2015-06-30
  Administered 2015-06-30 (×4): 10 meq via INTRAVENOUS
  Filled 2015-06-30 (×4): qty 50

## 2015-06-30 MED ORDER — CIPROFLOXACIN IN D5W 400 MG/200ML IV SOLN
400.0000 mg | Freq: Two times a day (BID) | INTRAVENOUS | Status: DC
  Administered 2015-06-30: 400 mg via INTRAVENOUS
  Filled 2015-06-30 (×2): qty 200

## 2015-06-30 MED ORDER — BUTAMBEN-TETRACAINE-BENZOCAINE 2-2-14 % EX AERO
INHALATION_SPRAY | CUTANEOUS | Status: DC | PRN
  Administered 2015-06-30: 2 via TOPICAL

## 2015-06-30 MED ORDER — FOLIC ACID 5 MG/ML IJ SOLN
1.0000 mg | Freq: Every day | INTRAMUSCULAR | Status: DC
  Administered 2015-06-30 – 2015-07-08 (×9): 1 mg via INTRAVENOUS
  Filled 2015-06-30 (×2): qty 0.2
  Filled 2015-06-30: qty 0
  Filled 2015-06-30: qty 0.2
  Filled 2015-06-30: qty 0
  Filled 2015-06-30 (×5): qty 0.2
  Filled 2015-06-30: qty 0
  Filled 2015-06-30: qty 0.2
  Filled 2015-06-30 (×4): qty 0
  Filled 2015-06-30: qty 0.2

## 2015-06-30 MED ORDER — SODIUM CHLORIDE 0.9 % IV SOLN: Freq: Once | INTRAVENOUS | Status: DC

## 2015-06-30 MED ORDER — DIPHENHYDRAMINE HCL 50 MG/ML IJ SOLN
INTRAMUSCULAR | Status: AC
  Filled 2015-06-30: qty 1

## 2015-06-30 MED ORDER — THIAMINE HCL 100 MG/ML IJ SOLN
100.0000 mg | Freq: Every day | INTRAMUSCULAR | Status: DC
  Administered 2015-06-30 – 2015-07-08 (×9): 100 mg via INTRAVENOUS
  Filled 2015-06-30 (×16): qty 1

## 2015-06-30 MED ORDER — CHLORHEXIDINE GLUCONATE 0.12 % MT SOLN
15.0000 mL | Freq: Two times a day (BID) | OROMUCOSAL | Status: DC
  Administered 2015-06-30 – 2015-07-03 (×6): 15 mL via OROMUCOSAL

## 2015-06-30 MED ORDER — INSULIN ASPART 100 UNIT/ML ~~LOC~~ SOLN
0.0000 [IU] | SUBCUTANEOUS | Status: DC
  Administered 2015-06-30: 15 [IU] via SUBCUTANEOUS

## 2015-06-30 NOTE — Progress Notes (Signed)
eLink Physician-Brief Progress Note Patient Name: Derrick [name] DOB: 1971/02/05 MRN: [id number]   Date of Service  07-15-15  HPI/Events of Note  Hyperglycemia with blood sugar greater than 200 now that insulin gtt off.  Patient is NPO.  Had some AKI but suspect related to lab alterations due to GIB.  Making urine.  eICU Interventions  Placed on q4 hour SSI moderate scale.     Intervention Category Intermediate Interventions: Hyperglycemia - evaluation and treatment  DETERDING,ELIZABETH 2015/07/15, 11:46 PM

## 2015-06-30 NOTE — Progress Notes (Signed)
PULMONARY / CRITICAL CARE MEDICINE   Name: Derrick [name] MRN: [id number] DOB: May 14, 1970    ADMISSION DATE:  07/24/2015 CONSULTATION DATE:  3/1  REFERRING MD:  EDP  CHIEF COMPLAINT:  AMS  BRIEF:  45 y/o male with alcoholic cirrhosis admitted on 3/1 with an upper GI bleed.   SUBJECTIVE:  EGD this morning> no active bleeding, had varices banded No melena this moring No hematemesis   VITAL SIGNS: BP 138/90 mmHg  Pulse 90  Temp(Src) 98.4 F (36.9 C) (Oral)  Resp 17  Ht  (1.702 m)  Wt 78.1 kg (172 lb 2.9 oz)  BMI 26.96 kg/m2  SpO2 100%  HEMODYNAMICS:    VENTILATOR SETTINGS:    INTAKE / OUTPUT: I/O last 3 completed shifts: In: 4804.6 [I.V.:3033.6; Blood:1471; IV Piggyback:300] Out: 1025 [Urine:825; Stool:200]  PHYSICAL EXAMINATION: General:  Chronically ill appearing, reticent HENT: NCAT OP clear PULM: CTAB, normal effort CV: RRR, no mgr GI: distended but soft, buldging flanks Derm: palmar erythema noted Neuro: arouses to voice, does not focus on examiner  LABS:  BMET  Recent Labs Lab 07/23/2015 1354 Jul 03, 2015 0345 07-03-15 0916  NA 134* 134* 132*  K 4.0 2.8* 2.6*  CL 83* 94* 94*  CO2 16* 23 26  BUN 98* 101* 92*  CREATININE 6.02* 4.04* 3.03*  GLUCOSE 198* 398* 464*    Electrolytes  Recent Labs Lab 07/27/2015 1354 07-03-15 0345 07/03/2015 0916  CALCIUM 7.5* 6.9* 6.5*    CBC  Recent Labs Lab 07/03/2015 1354 July 03, 2015 0345 Jul 03, 2015 0915  WBC 18.8* 15.8* 15.2*  HGB 4.8* 6.3* 7.5*  HCT 14.3* 17.9* 20.9*  PLT 168 77* 64*    Coag's  Recent Labs Lab 07/23/2015 1354 Jul 03, 2015 0345  APTT  --  36  INR 4.08* 2.51*    Sepsis Markers No results for input(s): LATICACIDVEN, PROCALCITON, O2SATVEN in the last 168 hours.  ABG No results for input(s): PHART, PCO2ART, PO2ART in the last 168 hours.  Liver Enzymes  Recent Labs Lab 07/17/2015 1354  AST <5*  ALT <5*  ALKPHOS 52  BILITOT 4.6*  ALBUMIN 1.7*    Cardiac Enzymes No results  for input(s): TROPONINI, PROBNP in the last 168 hours.  Glucose  Recent Labs Lab 06/23/15 2205 06/24/15 0810 07/19/2015 1907 07/17/2015 2358 July 03, 2015 0345 07/03/15 0759  GLUCAP 137* 132* 249* 365* 348* 364*    Imaging Dg Chest Port 1 View  07/22/2015  CLINICAL DATA:  Gastrointestinal tract bleeding, placement of central venous line EXAM: PORTABLE CHEST 1 VIEW COMPARISON:  Chest x-ray of 07/13/2015 FINDINGS: A right IJ central venous line is present with the tip overlying the mid SVC. No pneumothorax is seen. The lungs are not well aerated with volume loss bilaterally. The heart is within normal limits in size. No bony abnormality is seen. IMPRESSION: Right IJ central venous line tip overlies the mid SVC. No pneumothorax pre Electronically Signed   By: Dwyane Dee M.D.   On: 07/03/2015 16:55   Dg Chest Portable 1 View  07/29/2015  CLINICAL DATA:  Hypotension EXAM: PORTABLE CHEST 1 VIEW COMPARISON:  Chest CT June 21, 2015 FINDINGS: There is no edema or consolidation. Heart size and pulmonary vascularity are normal. No adenopathy. No bone lesions. IMPRESSION: No edema or consolidation. Electronically Signed   By: Bretta Bang III M.D.   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  Esophageal varices noted.   EGD 3/2>>> varices noted, no active bleeding,  banding performed  CULTURES: BC x 2  3/1>>>  ANTIBIOTICS: Ceftriaxone 3/1>>>  SIGNIFICANT EVENTS:   LINES/TUBES: R IJ Cortis 3/1>>>  DISCUSSION: 45yo male with hx ETOH cirrhosis, ongoing ETOH use with recurrent GI bleed and AKI.   ASSESSMENT / PLAN:  GASTROINTESTINAL Upper GI bleed presumed due to esophageal varices  ETOH cirrhosis  Ascites P:   Vasopressin 0.4 u/hr gtt for GI bleed  Continue Octreotide, protonix gtt  NPO  Empiric SBP coverage as below  F/u LFT's  PULMONARY No active issue  P:   Monitor airway protection closely with UGI bleeding  Supplemental  O2 as needed   CARDIOVASCULAR Hypotension - resolved P:  Tele  RENAL AKI - improving with fluids  Hypokalemia P:   Continue IVF, change to KCL containing Replete K Monitor BMET and UOP Replace electrolytes as needed  HEMATOLOGIC Blood loss anemia  Coagulopathy - r/t cirrhosis  P:  Repeat CBC later today F/u INR in am   INFECTIOUS At risk SBP with GI bleed, cirrhosis  P:   Empiric ceftriaxone  Stop cipro  ENDOCRINE Hyperglycemia  Likely due to octreotide P:   Monitor glucose on chem  Continue insulin gtt  NEUROLOGIC AMS  ETOH abuse  P:   Thiamine Folate Prn ativan   FAMILY  - Updates:  Family (mother and aunt) updated at length at bedside 3/1  Discussed with Dr Randa Evens (GI) at bedside on 3/2  Critical Care Time devoted to patient care services described in this note is33 Minutes. This time reflects time of care of this signee: Derrick Jennings MD. This critical care time does not reflect procedure time, or teaching time or supervisory time of PA/NP/Med student/Med Resident etc but could involve care discussion time. Rest per NP/medical resident whose note is outlined above and that I agree with   Derrick , MD  PCCM Pager: [phone] Cell: [phone] After 3pm or if no response, call [phone]   07/04/2015 10:28 AM

## 2015-06-30 NOTE — Progress Notes (Signed)
eLink Physician-Brief Progress Note Patient Name: Derrick [name] DOB: 1970-06-30 MRN: [id number]   Date of Service  2015-07-16  HPI/Events of Note  Hb=6.3  eICU Interventions  Transfuse 1 unit.      Intervention Category Major Interventions: Hemorrhage - evaluation and management  Shane Crutch Jul 16, 2015, 5:31 AM

## 2015-06-30 NOTE — H&P (View-Only) (Signed)
[facility] CON[facility] Reason for consult:UGI bleeding in cirrhotic with know varices Referring Physician: ER no PCP  Derrick [name] is an 45 y.o. male.  HPI: patient was just hospitalized last week and sent home 5 days ago. He's a known cirrhotic who continues to drink. He had EGD by Dr. Paulita Fujita showing portal gastropathy of the significant nature and 2+ varices it had not been bleeding. No banding was performed. The patient went home with plans to not drink and was started on and row. Per his mother it's not clear if he's been taking it. He apparently has not had much alcohol since returning home but has had 'a couple of beers". He has just been laying in bed. According to his mother he had coffee ground emesis but no bright blood and pass melenic stools. His hemoglobin has dropped from 11.12/24 to 4.8. He is quite pale  Past Medical History  Diagnosis Date  . Hypertension   . ETOH abuse   . Cirrhosis ([place])   . GI bleed     Past Surgical History  Procedure Laterality Date  . Tonsillectomy    . Esophagogastroduodenoscopy (egd) with propofol N/A 06/23/2015    Procedure: ESOPHAGOGASTRODUODENOSCOPY (EGD) WITH PROPOFOL;  Surgeon: Arta Silence, MD;  Location: [facility];  Service: Endoscopy;  Laterality: N/A;    Family History  Problem Relation Age of Onset  . Hypertension Father     Social History:  reports that he has been smoking Cigarettes.  He has never used smokeless tobacco. He reports that he drinks alcohol. He reports that he uses illicit drugs (Marijuana).  Allergies: No Known Allergies  Medications; Prior to Admission medications   Medication Sig Start Date End Date Taking? Authorizing Provider  ciprofloxacin (CIPRO) 500 MG tablet Take 1 tablet (500 mg total) by mouth 2 (two) times daily. 06/24/15  Yes Geradine Girt, DO  furosemide (LASIX) 40 MG tablet Take 1 tablet (40 mg total) by mouth daily. 06/24/15  Yes Jessica U Vann, DO  pantoprazole (PROTONIX) 40 MG tablet  Take 1 tablet (40 mg total) by mouth daily. 06/24/15  Yes Geradine Girt, DO  propranolol (INDERAL) 10 MG tablet Take 1 tablet (10 mg total) by mouth 2 (two) times daily. 06/24/15  Yes Geradine Girt, DO  spironolactone (ALDACTONE) 100 MG tablet Take 1 tablet (100 mg total) by mouth daily. 06/24/15  Yes Geradine Girt, DO   . octreotide  50 mcg Intravenous Once  . [START ON 07/03/2015] pantoprazole (PROTONIX) IV  40 mg Intravenous Q12H   PRN Meds sodium chloride, albuterol, ondansetron (ZOFRAN) IV Results for orders placed or performed during the hospital encounter of 07/11/2015 (from the past 48 hour(s))  Prepare fresh frozen plasma     Status: None   Collection Time: 07/18/2015  1:34 PM  Result Value Ref Range   Unit Number [id number]    Blood Component Type LIQ PLASMA    Unit division 00    Status of Unit REL FROM [facility]    Unit tag comment VERBAL ORDERS PER DR YAO    Transfusion Status OK TO TRANSFUSE    Unit Number [id number]    Blood Component Type LIQ PLASMA    Unit division 00    Status of Unit REL FROM [facility]    Unit tag comment VERBAL ORDERS PER DR YAO    Transfusion Status OK TO TRANSFUSE   Prepare RBC     Status: None   Collection Time: 07/12/2015  1:44 PM  Result Value Ref Range   Order Confirmation ORDER PROCESSED BY BLOOD BANK   Type and screen     Status: None (Preliminary result)   Collection Time: 07/07/2015  1:44 PM  Result Value Ref Range   ABO/RH(D) A POS    Antibody Screen NEG    Sample Expiration 07/02/2015    Unit Number [id number]    Blood Component Type RBC LR PHER1    Unit division 00    Status of Unit ISSUED    Unit tag comment VERBAL ORDERS PER DR YAO    Transfusion Status OK TO TRANSFUSE    Crossmatch Result COMPATIBLE    Unit Number [id number]    Blood Component Type RBC LR PHER1    Unit division 00    Status of Unit REL FROM [facility]    Unit tag comment VERBAL ORDERS PER DR YAO    Transfusion Status OK TO TRANSFUSE    Crossmatch Result  COMPATIBLE    Unit Number [id number]    Blood Component Type RED CELLS,LR    Unit division 00    Status of Unit ISSUED    Transfusion Status OK TO TRANSFUSE    Crossmatch Result Compatible    Unit Number [id number]    Blood Component Type RED CELLS,LR    Unit division 00    Status of Unit ALLOCATED    Transfusion Status OK TO TRANSFUSE    Crossmatch Result Compatible    Unit Number [id number]    Blood Component Type RBC LR PHER2    Unit division 00    Status of Unit ALLOCATED    Transfusion Status OK TO TRANSFUSE    Crossmatch Result Compatible   ABO/Rh     Status: None   Collection Time: 07/03/2015  1:44 PM  Result Value Ref Range   ABO/RH(D) A POS   Ethanol     Status: None   Collection Time: 07/29/2015  1:53 PM  Result Value Ref Range   Alcohol, Ethyl (B) <5 <5 mg/dL    Comment:        LOWEST DETECTABLE LIMIT FOR SERUM ALCOHOL IS 5 mg/dL FOR MEDICAL PURPOSES ONLY REPEATED TO VERIFY   CBC with Differential     Status: Abnormal   Collection Time: 07/01/2015  1:54 PM  Result Value Ref Range   WBC 18.8 (H) 4.0 - 10.5 K/uL   RBC 1.35 (L) 4.22 - 5.81 MIL/uL   Hemoglobin 4.8 (LL) 13.0 - 17.0 g/dL    Comment: REPEATED TO VERIFY CRITICAL RESULT CALLED TO, READ BACK BY AND VERIFIED WITH: J WATTS,RN AT 1419 07/13/2015 BY K BARR    HCT 14.3 (L) 39.0 - 52.0 %   MCV 105.9 (H) 78.0 - 100.0 fL   MCH 35.6 (H) 26.0 - 34.0 pg   MCHC 33.6 30.0 - 36.0 g/dL   RDW 16.0 (H) 11.5 - 15.5 %   Platelets 168 150 - 400 K/uL   Neutrophils Relative % 70 %   Neutro Abs 13.1 (H) 1.7 - 7.7 K/uL   Lymphocytes Relative 14 %   Lymphs Abs 2.6 0.7 - 4.0 K/uL   Monocytes Relative 16 %   Monocytes Absolute 3.0 (H) 0.1 - 1.0 K/uL   Eosinophils Relative 0 %   Eosinophils Absolute 0.0 0.0 - 0.7 K/uL   Basophils Relative 0 %   Basophils Absolute 0.0 0.0 - 0.1 K/uL  Comprehensive metabolic panel     Status: Abnormal   Collection Time: 07/15/2015  1:54 PM  Result  Value Ref Range   Sodium 134 (L) 135 -  145 mmol/L   Potassium 4.0 3.5 - 5.1 mmol/L   Chloride 83 (L) 101 - 111 mmol/L   CO2 16 (L) 22 - 32 mmol/L   Glucose, Bld 198 (H) 65 - 99 mg/dL   BUN 98 (H) 6 - 20 mg/dL   Creatinine, Ser 6.02 (H) 0.61 - 1.24 mg/dL   Calcium 7.5 (L) 8.9 - 10.3 mg/dL   Total Protein 4.2 (L) 6.5 - 8.1 g/dL   Albumin 1.7 (L) 3.5 - 5.0 g/dL   AST <5 (L) 15 - 41 U/L    Comment: REPEATED TO VERIFY   ALT <5 (L) 17 - 63 U/L    Comment: REPEATED TO VERIFY   Alkaline Phosphatase 52 38 - 126 U/L   Total Bilirubin 4.6 (H) 0.3 - 1.2 mg/dL   GFR calc non Af Amer 10 (L) >60 mL/min   GFR calc Af Amer 12 (L) >60 mL/min    Comment: (NOTE) The eGFR has been calculated using the CKD EPI equation. This calculation has not been validated in all clinical situations. eGFR's persistently <60 mL/min signify possible Chronic Kidney Disease.    Anion gap 35 (H) 5 - 15  Protime-INR     Status: Abnormal   Collection Time: 07/20/2015  1:54 PM  Result Value Ref Range   Prothrombin Time 38.6 (H) 11.6 - 15.2 seconds   INR 4.08 (H) 0.00 - 1.49  POC occult blood, ED Provider will collect     Status: Abnormal   Collection Time: 07/15/2015  2:31 PM  Result Value Ref Range   Fecal Occult Bld POSITIVE (A) NEGATIVE    Dg Chest Portable 1 View  07/16/2015  CLINICAL DATA:  Hypotension EXAM: PORTABLE CHEST 1 VIEW COMPARISON:  Chest CT June 21, 2015 FINDINGS: There is no edema or consolidation. Heart size and pulmonary vascularity are normal. No adenopathy. No bone lesions. IMPRESSION: No edema or consolidation. Electronically Signed   By: Lowella Grip III M.D.   On: 07/21/2015 14:03               Blood pressure 102/59, pulse 113, temperature 98.7 F (37.1 C), temperature source Oral, resp. rate 23, height _0  (1.702 m), weight 75.297 kg (166 lb), SpO2 100 %.  Physical exam:   General--Pale tattooed white male. No distress. ENT-- icteric, mucous membranes dry  Heart-- tachycardic Lungs-- clear Abdomen-- soft and  nontender except tender overdeliver. Liver appears to be enlarged Psych-- oriented toward year in place. Seems slightly confused.   Assessment: 1. Upper G.I. bleed. He has had coffee ground emesis and this could well be from portal gastropathy but it is possibly could have bled from varices. He will need to be scoped again. 2. Acute renal failure. Possibly could be developing hepatorenal syndrome. 3. Cirrhosis with varices and portal gastropathy. Patient continuing to drink.  Plan: 1. He will be admitted to critical care medicine in the central line will be placed. 2. Initially he will be treated with octreotide infusion IV Protonix. 3. We have discussed with mother and will plan on EGD with banning of esophageal varices at 7:30 AM tomorrow with MAC. This will give some time to transfuse. Should he bleed heavily tonight he will be ready to go ahead and have EGD with esophageal banding tonight. This is all been discussed with the patient and with his mother.   Haja Crego JR,Naziya Hegwood L 07/11/2015, 4:14 PM   This note was created using  voice recognition software and minor errors may Have occurred unintentionally. Pager: [phone] If no answer or after hours call [phone]

## 2015-06-30 NOTE — Op Note (Signed)
[facility] [address] [place], [address]   ENDOSCOPY PROCEDURE REPORT  PATIENT: Derrick, [name]  MR#: [id number] BIRTHDATE: 1971/04/06 , 44  yrs. 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  The Pentax Gastroscope [id number]  endoscope was introduced through the mouth  and advanced to the second portion of the duodenum .  The instrument was slowly withdrawn as the mucosa was fully examined. Estimated blood loss is zero unless otherwise noted in this procedure report. Atthe time of the procedure there was no active bleeding. The 2nd duodenum and duodenal bulb for free of ulcerations or lesions. There was a large amount of liquid with some coffee ground material of a minimal nature in the stomach. No gross*varices in the retroflex view. The scope was withdrawn and there were areas of bleeding from the scope brushing against the mucosa. There were esophageal varices present but these were not particularly large. Given the fact that he had had such a significant bleed I decided to go ahead and play some bands. The scope was withdrawn in the multiple bander was placed on the scope and the scope was reinserted. A total of 6 bands were fired with 2 failing to be placed. Therefore for bands were placed primarily just above the GE junction. There was no active bleeding at the termination the procedure. The patient tolerated this well.    The scope was then withdrawn from the patient and the procedure completed.  COMPLICATIONS: There were no  immediate complications.  ENDOSCOPIC IMPRESSION: 1. Esophageal Varices. No gross bleeding at the time of the endoscopy but given the extent of recent bleeding and drop in hemogobn 4 bands were placed just above the GE junction. RECOMMENDATIONS: 1. will give ice chips only for the next 24 hours. 2, will give 2 doses of Cipro for prophylaxis after banding 3. Would keep patient on octreotide infusion as well as PPI IV   _______________________________ eSignedCarman Ching, MD 07/22/15 8:44 AM     cc:  CPT CODES: ICD CODES:  The ICD and CPT codes recommended by this software are interpretations from the data that the clinical staff has captured with the software.  The verification of the translation of this report to the ICD and CPT codes and modifiers is the sole responsibility of the health care institution and practicing physician where this report was generated.  [employer] will not be held responsible for the validity of the ICD and CPT codes included on this report.  AMA assumes no liability for data contained or not contained herein. CPT is a Publishing rights manager of the [employer].  PATIENT NAME:  Derrick, [name] MR#: [id number]

## 2015-06-30 NOTE — Progress Notes (Signed)
CRITICAL VALUE ALERT  Critical value received: K 2.6  Date of notification:  07/20/2015  Time of notification:  1110  Critical value read back:Yes.    Nurse who received alert:  Irven Coe, RN  MD notified (1st page):  Dr. Kendrick Fries  Time of first page:  1120  Responding MD:  Dr. Lorrin Goodell, Orders placed to repeat K. Will continue to monitor patient appropriately.   Time MD responded:  1120

## 2015-06-30 NOTE — Interval H&P Note (Signed)
History and Physical Interval Note:  07/23/2015 8:08 AM  Derrick [name]  has presented today for surgery, with the diagnosis of GI bleed  The various methods of treatment have been discussed with the patient and family. After consideration of risks, benefits and other options for treatment, the patient has consented to  Procedure(s) with comments: ESOPHAGOGASTRODUODENOSCOPY (EGD) (N/A) - banding of varices  bedside; pt intubated and sedated as a surgical intervention .  The patient's history has been reviewed, patient examined, no change in status, stable for surgery.  I have reviewed the patient's chart and labs.  Questions were answered to the patient's satisfaction.     [name],Kirti Carl L

## 2015-06-30 NOTE — Progress Notes (Addendum)
eLink Physician-Brief Progress Note Patient Name: Derrick [name] DOB: [birth year] MRN: [id number]   Date of Service  09-Jul-2015  HPI/Events of Note  Glucose elevated above 300 ; not improved with higher scale  eICU Interventions  Will change to IV insulin.      Intervention Category Intermediate Interventions: Hyperglycemia - evaluation and treatment  Sherylann Vangorden 07/01/2015, 12:05 AM

## 2015-06-30 NOTE — Progress Notes (Signed)
UR Completed. Araeya Lamb, RN, BSN.  [phone] 

## 2015-06-30 NOTE — Care Management Note (Addendum)
Case Management Note  Patient Details  Name: ETAI COPADO MRN: [id number] Date of Birth: [birth year]  Subjective/Objective:     Pt admitted with Upper GI bleed               Action/Plan:  Pt is from home independent ; recently discharge for upper GI bleed - pt has cirrhosis and continues to drink.  Mom serves as support person.  CSW assessed pt and provided substance abuse counseling on last admit.  CM will continue to follow for disposition needs  Expected Discharge Date:                  Expected Discharge Plan:  Home/Self Care  In-House Referral:  Clinical Social Work  Discharge planning Services  CM Consult  Post Acute Care Choice:    Choice offered to:     DME Arranged:    DME Agency:     HH Arranged:    HH Agency:     Status of Service:  In process, will continue to follow  Medicare Important Message Given:    Date Medicare IM Given:    Medicare IM give by:    Date Additional Medicare IM Given:    Additional Medicare Important Message give by:     If discussed at Long Length of Stay Meetings, dates discussed:    Additional Comments:  Cherylann Parr, RN 07/06/2015, 9:56 AM

## 2015-07-01 DIAGNOSIS — K7290 Hepatic failure, unspecified without coma: Secondary | ICD-10-CM

## 2015-07-01 LAB — BASIC METABOLIC PANEL
ANION GAP: 13 (ref 5–15)
Anion gap: 11 (ref 5–15)
Anion gap: 12 (ref 5–15)
BUN: 70 mg/dL — AB (ref 6–20)
BUN: 60 mg/dL — ABNORMAL HIGH (ref 6–20)
BUN: 48 mg/dL — AB (ref 6–20)
CALCIUM: 7 mg/dL — AB (ref 8.9–10.3)
CALCIUM: 7.3 mg/dL — AB (ref 8.9–10.3)
CALCIUM: 7.1 mg/dL — AB (ref 8.9–10.3)
CHLORIDE: 98 mmol/L — AB (ref 101–111)
CHLORIDE: 97 mmol/L — AB (ref 101–111)
CO2: 27 mmol/L (ref 22–32)
CO2: 27 mmol/L (ref 22–32)
CO2: 26 mmol/L (ref 22–32)
CREATININE: 1.75 mg/dL — AB (ref 0.61–1.24)
CREATININE: 1.42 mg/dL — AB (ref 0.61–1.24)
CREATININE: 1.29 mg/dL — AB (ref 0.61–1.24)
Chloride: 94 mmol/L — ABNORMAL LOW (ref 101–111)
GFR calc Af Amer: 54 mL/min — ABNORMAL LOW (ref >60)
GFR calc non Af Amer: 60 mL/min — ABNORMAL LOW (ref >60)
GFR calc non Af Amer: >60 mL/min (ref >60)
GFR, EST NON AFRICAN AMERICAN: 46 mL/min — AB (ref >60)
GLUCOSE: 343 mg/dL — AB (ref 65–99)
GLUCOSE: 259 mg/dL — AB (ref 65–99)
Glucose, Bld: 298 mg/dL — ABNORMAL HIGH (ref 65–99)
POTASSIUM: 2.4 mmol/L — AB (ref 3.5–5.1)
Potassium: 2.6 mmol/L — CL (ref 3.5–5.1)
Potassium: 2.9 mmol/L — ABNORMAL LOW (ref 3.5–5.1)
SODIUM: 135 mmol/L (ref 135–145)
Sodium: 134 mmol/L — ABNORMAL LOW (ref 135–145)
Sodium: 136 mmol/L (ref 135–145)

## 2015-07-01 LAB — GLUCOSE, CAPILLARY
GLUCOSE-CAPILLARY: 244 mg/dL — AB (ref 65–99)
GLUCOSE-CAPILLARY: 259 mg/dL — AB (ref 65–99)
GLUCOSE-CAPILLARY: 277 mg/dL — AB (ref 65–99)
Glucose-Capillary: 111 mg/dL — ABNORMAL HIGH (ref 65–99)
Glucose-Capillary: 296 mg/dL — ABNORMAL HIGH (ref 65–99)
Glucose-Capillary: 235 mg/dL — ABNORMAL HIGH (ref 65–99)
Glucose-Capillary: 215 mg/dL — ABNORMAL HIGH (ref 65–99)

## 2015-07-01 LAB — CBC WITH DIFFERENTIAL/PLATELET
BASOS ABS: 0 10*3/uL (ref 0.0–0.1)
BASOS PCT: 0 %
Basophils Absolute: 0 10*3/uL (ref 0.0–0.1)
Basophils Relative: 0 %
Eosinophils Absolute: 0 10*3/uL (ref 0.0–0.7)
Eosinophils Absolute: 0.1 10*3/uL (ref 0.0–0.7)
Eosinophils Relative: 0 %
Eosinophils Relative: 0 %
HEMATOCRIT: 20.6 % — AB (ref 39.0–52.0)
HEMATOCRIT: 22 % — AB (ref 39.0–52.0)
HEMOGLOBIN: 7.1 g/dL — AB (ref 13.0–17.0)
HEMOGLOBIN: 7.8 g/dL — AB (ref 13.0–17.0)
LYMPHS ABS: 2.1 10*3/uL (ref 0.7–4.0)
LYMPHS PCT: 12 %
LYMPHS PCT: 12 %
Lymphs Abs: 1.5 10*3/uL (ref 0.7–4.0)
MCH: 32.3 pg (ref 26.0–34.0)
MCH: 33.2 pg (ref 26.0–34.0)
MCHC: 34.5 g/dL (ref 30.0–36.0)
MCHC: 35.5 g/dL (ref 30.0–36.0)
MCV: 93.6 fL (ref 78.0–100.0)
MCV: 93.6 fL (ref 78.0–100.0)
MONO ABS: 1.4 10*3/uL — AB (ref 0.1–1.0)
MONO ABS: 2.5 10*3/uL — AB (ref 0.1–1.0)
MONOS PCT: 14 %
Monocytes Relative: 11 %
NEUTROS ABS: 9.5 10*3/uL — AB (ref 1.7–7.7)
NEUTROS ABS: 13.4 10*3/uL — AB (ref 1.7–7.7)
NEUTROS PCT: 77 %
NEUTROS PCT: 74 %
Platelets: 58 10*3/uL — ABNORMAL LOW (ref 150–400)
Platelets: 75 10*3/uL — ABNORMAL LOW (ref 150–400)
RBC: 2.2 MIL/uL — AB (ref 4.22–5.81)
RBC: 2.35 MIL/uL — ABNORMAL LOW (ref 4.22–5.81)
RDW: 18.6 % — AB (ref 11.5–15.5)
RDW: 19 % — AB (ref 11.5–15.5)
WBC: 12.4 10*3/uL — ABNORMAL HIGH (ref 4.0–10.5)
WBC: 18 10*3/uL — ABNORMAL HIGH (ref 4.0–10.5)

## 2015-07-01 LAB — CBC
HCT: 22.1 % — ABNORMAL LOW (ref 39.0–52.0)
HEMOGLOBIN: 7.7 g/dL — AB (ref 13.0–17.0)
MCH: 32.5 pg (ref 26.0–34.0)
MCHC: 34.8 g/dL (ref 30.0–36.0)
MCV: 93.2 fL (ref 78.0–100.0)
Platelets: 61 10*3/uL — ABNORMAL LOW (ref 150–400)
RBC: 2.37 MIL/uL — ABNORMAL LOW (ref 4.22–5.81)
RDW: 18.7 % — AB (ref 11.5–15.5)
WBC: 13.4 10*3/uL — ABNORMAL HIGH (ref 4.0–10.5)

## 2015-07-01 LAB — PREPARE FRESH FROZEN PLASMA
UNIT DIVISION: 0
UNIT DIVISION: 0
UNIT DIVISION: 0
Unit division: 0

## 2015-07-01 LAB — URINE CULTURE: Culture: NO GROWTH

## 2015-07-01 LAB — MAGNESIUM
Magnesium: 1.4 mg/dL — ABNORMAL LOW (ref 1.7–2.4)
Magnesium: 2.5 mg/dL — ABNORMAL HIGH (ref 1.7–2.4)

## 2015-07-01 LAB — PHOSPHORUS: PHOSPHORUS: 1.3 mg/dL — AB (ref 2.5–4.6)

## 2015-07-01 MED ORDER — POTASSIUM CHLORIDE IN NACL 40-0.9 MEQ/L-% IV SOLN
INTRAVENOUS | Status: DC
  Administered 2015-07-01 (×2): 75 mL/h via INTRAVENOUS
  Administered 2015-07-02 – 2015-07-05 (×4): 50 mL/h via INTRAVENOUS
  Filled 2015-07-01 (×9): qty 1000

## 2015-07-01 MED ORDER — POTASSIUM CHLORIDE 10 MEQ/50ML IV SOLN
10.0000 meq | INTRAVENOUS | Status: AC
Start: 2015-07-01 — End: 2015-07-02
  Administered 2015-07-01 – 2015-07-02 (×6): 10 meq via INTRAVENOUS
  Filled 2015-07-01 (×6): qty 50

## 2015-07-01 MED ORDER — MAGNESIUM SULFATE 4 GM/100ML IV SOLN
4.0000 g | Freq: Once | INTRAVENOUS | Status: AC
  Administered 2015-07-01 (×2): 4 g via INTRAVENOUS
  Filled 2015-07-01: qty 100

## 2015-07-01 MED ORDER — POTASSIUM CHLORIDE 10 MEQ/50ML IV SOLN
10.0000 meq | INTRAVENOUS | Status: AC
Start: 2015-07-01 — End: 2015-07-01
  Administered 2015-07-01 (×2): 10 meq via INTRAVENOUS
  Filled 2015-07-01 (×2): qty 50

## 2015-07-01 MED ORDER — POTASSIUM PHOSPHATES 15 MMOLE/5ML IV SOLN
40.0000 meq | Freq: Once | INTRAVENOUS | Status: AC
  Administered 2015-07-01: 40 meq via INTRAVENOUS
  Filled 2015-07-01 (×2): qty 9.09

## 2015-07-01 MED ORDER — LACTULOSE 10 GM/15ML PO SOLN
30.0000 g | Freq: Two times a day (BID) | ORAL | Status: DC
  Administered 2015-07-01 – 2015-07-06 (×9): 30 g via ORAL
  Filled 2015-07-01 (×22): qty 45

## 2015-07-01 MED ORDER — PHYTONADIONE 5 MG PO TABS
5.0000 mg | ORAL_TABLET | Freq: Every day | ORAL | Status: DC
  Filled 2015-07-01 (×2): qty 1

## 2015-07-01 MED ORDER — POTASSIUM CHLORIDE 10 MEQ/50ML IV SOLN
10.0000 meq | INTRAVENOUS | Status: AC
  Administered 2015-07-01 (×6): 10 meq via INTRAVENOUS
  Filled 2015-07-01 (×6): qty 50

## 2015-07-01 MED ORDER — LORAZEPAM 2 MG/ML IJ SOLN
2.0000 mg | INTRAMUSCULAR | Status: DC | PRN
  Administered 2015-07-02 – 2015-07-03 (×4): 2 mg via INTRAVENOUS
  Administered 2015-07-03 (×3): 3 mg via INTRAVENOUS
  Administered 2015-07-03 (×2): 2 mg via INTRAVENOUS
  Administered 2015-07-03 – 2015-07-04 (×3): 3 mg via INTRAVENOUS
  Administered 2015-07-04: 2 mg via INTRAVENOUS
  Administered 2015-07-04 (×2): 3 mg via INTRAVENOUS
  Administered 2015-07-04 (×2): 2 mg via INTRAVENOUS
  Administered 2015-07-04 – 2015-07-05 (×3): 3 mg via INTRAVENOUS
  Administered 2015-07-05 – 2015-07-06 (×9): 2 mg via INTRAVENOUS
  Filled 2015-07-01: qty 2
  Filled 2015-07-01 (×2): qty 1
  Filled 2015-07-01: qty 2
  Filled 2015-07-01: qty 1
  Filled 2015-07-01: qty 2
  Filled 2015-07-01 (×3): qty 1
  Filled 2015-07-01: qty 2
  Filled 2015-07-01 (×2): qty 1
  Filled 2015-07-01: qty 2
  Filled 2015-07-01: qty 1
  Filled 2015-07-01: qty 2
  Filled 2015-07-01 (×2): qty 1
  Filled 2015-07-01: qty 2
  Filled 2015-07-01 (×4): qty 1
  Filled 2015-07-01 (×2): qty 2
  Filled 2015-07-01: qty 1
  Filled 2015-07-01: qty 2
  Filled 2015-07-01 (×5): qty 1

## 2015-07-01 NOTE — Progress Notes (Signed)
Inpatient Diabetes Program Recommendations  AACE/ADA: New Consensus Statement on Inpatient Glycemic Control (2015)  Target Ranges:  Prepandial:   less than 140 mg/dL      Peak postprandial:   less than 180 mg/dL (1-2 hours)      Critically ill patients:  140 - 180 mg/dL   Review of Glycemic Control  Results for Derrick [name], Derrick [name] (MRN [id number]) as of 07/01/2015 12:24  Ref. Range 07/20/2015 23:37 07/01/2015 03:49 07/01/2015 08:08 07/01/2015 11:34  Glucose-Capillary Latest Ref Range: 65-99 mg/dL 259 (H) 296 (H) 277 (H) 235 (H)  Results for Derrick [name], Derrick [name] (MRN [id number]) as of 07/01/2015 12:24  Ref. Range 07/01/2015 04:09  Sodium Latest Ref Range: 135-145 mmol/L 134 (L)  Potassium Latest Ref Range: 3.5-5.1 mmol/L 2.4 (LL)  Chloride Latest Ref Range: 101-111 mmol/L 94 (L)  CO2 Latest Ref Range: 22-32 mmol/L 27  BUN Latest Ref Range: 6-20 mg/dL 70 (H)  Creatinine Latest Ref Range: 0.61-1.24 mg/dL 1.75 (H)  Calcium Latest Ref Range: 8.9-10.3 mg/dL 7.0 (L)  EGFR (Non-African Amer.) Latest Ref Range: >60 mL/min 46 (L)  EGFR (African American) Latest Ref Range: >60 mL/min 54 (L)  Glucose Latest Ref Range: 65-99 mg/dL 343 (H)  Anion gap Latest Ref Range: 5-15  13  Magnesium Latest Ref Range: 1.7-2.4 mg/dL 1.4 (L)   GlucoStabilizer discontinued. On Novolog moderate Q4H. Needs basal insulin.  Inpatient Diabetes Program Recommendations:    Add Levemir 10 units QHS.  Will continue to follow. Thank you. Lorenda Peck, RD, LDN, CDE Inpatient Diabetes Coordinator [phone]

## 2015-07-01 NOTE — Progress Notes (Signed)
eLink Physician-Brief Progress Note Patient Name: Derrick JewRobert W [name] DOB: 04/25/1971 MRN: [id number]   Date of Service  07/01/2015  HPI/Events of Note  Hypokalemia  eICU Interventions  Potassium replaced     Intervention Category Intermediate Interventions: Other:  DETERDING,ELIZABETH 07/01/2015, 9:31 PM

## 2015-07-01 NOTE — Progress Notes (Signed)
PULMONARY / CRITICAL CARE MEDICINE   Name: Derrick [name] MRN: [id number] DOB: 15-Nov-1970    ADMISSION DATE:  07/27/2015 CONSULTATION DATE:  3/1  REFERRING MD:  EDP  CHIEF COMPLAINT:  AMS  BRIEF:  45 y/o male with alcoholic cirrhosis admitted on 3/1 with an upper GI bleed.   SUBJECTIVE:  EGD yesterday > ? Variceal bleeding, 4 bands placed Still slow to answer questions Family says he is still drinking    VITAL SIGNS: BP 143/105 mmHg  Pulse 65  Temp(Src) 98.7 F (37.1 C) (Axillary)  Resp 17  Ht  (1.702 m)  Wt 75.3 kg (166 lb 0.1 oz)  BMI 25.99 kg/m2  SpO2 97%  HEMODYNAMICS:    VENTILATOR SETTINGS:    INTAKE / OUTPUT: I/O last 3 completed shifts: In: 9046.8 [I.V.:6675.8; Blood:1471; IV Piggyback:900] Out: 6400 [Urine:6200; Stool:200]  PHYSICAL EXAMINATION: General:  Chronically ill appearing, reticent  HENT: NCAT OP clear PULM: CTAB, normal effort CV: RRR, no mgr GI: distended but soft, buldging flanks Derm: palmar erythema noted Neuro: arouses to voice, does not focus on examiner, answers correctly to name and location, moves all four extremities  LABS:  BMET  Recent Labs Lab Jul 01, 2015 0916 2015-07-01 1820 07/01/15 0409  NA 132* 132* 134*  K 2.6* 2.8* 2.4*  CL 94* 94* 94*  CO2 BUN 92* 80* 70*  CREATININE 3.03* 2.09* 1.75*  GLUCOSE 464* 299* 343*    Electrolytes  Recent Labs Lab July 01, 2015 0916 Jul 01, 2015 1820 07/01/15 0409  CALCIUM 6.5* 6.8* 7.0*  MG  --   --  1.4*    CBC  Recent Labs Lab 07/01/15 0915 July 01, 2015 2014 07/01/15 0409  WBC 15.2* 15.1* 13.4*  HGB 7.5* 7.7* 7.7*  HCT 20.9* 22.1* 22.1*  PLT 64* 60* 61*    Coag's  Recent Labs Lab 07/10/2015 1354 07/01/2015 0345 07-01-2015 1117  APTT  --  36  --   INR 4.08* 2.51* 2.38*    Sepsis Markers No results for input(s): LATICACIDVEN, PROCALCITON, O2SATVEN in the last 168 hours.  ABG No results for input(s): PHART, PCO2ART, PO2ART in the last 168 hours.  Liver  Enzymes  Recent Labs Lab 07/07/2015 1354  AST <5*  ALT <5*  ALKPHOS 52  BILITOT 4.6*  ALBUMIN 1.7*    Cardiac Enzymes No results for input(s): TROPONINI, PROBNP in the last 168 hours.  Glucose  Recent Labs Lab Jul 01, 2015 1506 July 01, 2015 1616 07/01/2015 1720 07/01/15 2044 July 01, 2015 2337 07/01/15 0349  GLUCAP 147* 121* 111* 176* 259* 296*    Imaging No results found.   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  Esophageal varices noted.   EGD 3/2>>> varices noted, no active bleeding, banding performed  CULTURES: BC x 2  3/1>>>  ANTIBIOTICS: Ceftriaxone 3/1>>>  SIGNIFICANT EVENTS:   LINES/TUBES: R IJ Cortis/CVL 3/1>>>  DISCUSSION: 45yo male with hx ETOH cirrhosis, ongoing ETOH use with recurrent GI bleed and AKI. Persistent alcoholic encephalopathy.  ASSESSMENT / PLAN:  GASTROINTESTINAL Upper GI bleed presumed due to esophageal varices  ETOH cirrhosis  Ascites Hepatic encephalopathy P:   Stop Vasopressin 0.4 u/hr gtt  Continue Octreotide> causing hyperglycemia and thereby hypokalemia, will discuss stopping with GI Continue protonix gtt  Advance diet today Start lactulose today No narcotics Empiric SBP coverage as below  Monitor for bleeding  NEUROLOGIC Hepatic encephalopathy ETOH abuse  P:   Thiamine Folate Prn ativan Lactulose bid today> titrate to 3 stools  ENDOCRINE Hyperglycemia > due to  octreotide P:   Monitor glucose on chem  Continue insulin SSI for now Discuss stopping octreotide with GI  PULMONARY No active issue  P:   Monitor airway protection closely with UGI bleeding  Supplemental O2 as needed   CARDIOVASCULAR Hypotension - resolved P:  Tele  RENAL AKI - improving with fluids  Hypokalemia P:   Continue IVF> NS with 40mEq KCL Replete K Monitor BMET and UOP Replace electrolytes as needed  HEMATOLOGIC Blood loss anemia  Coagulopathy - r/t cirrhosis  P:  Repeat CBC later today F/u INR in  am   INFECTIOUS At risk SBP with GI bleed, cirrhosis  P:   Empiric ceftriaxone > 5 days    FAMILY  - Updates: Tried to call mother, discussed with aunt at length by phone 3/3  Critical Care Time devoted to patient care services described in this note is35 Minutes. This time reflects time of care of this signee: Heber CarolinaBrent McQuaid MD. This critical care time does not reflect procedure time, or teaching time or supervisory time of PA/NP/Med student/Med Resident etc but could involve care discussion time. Rest per NP/medical resident whose note is outlined above and that I agree with   Heber CarolinaBrent McQuaid, MD [facility] Pager: [phone] Cell: [phone] After 3pm or if no response, call [phone]   07/01/2015 8:40 AM

## 2015-07-01 NOTE — Progress Notes (Signed)
eLink Physician-Brief Progress Note Patient Name: Rudy JewRobert W Keagy DOB: Oct 04, 1970 MRN: [id number]   Date of Service  07/01/2015  HPI/Events of Note  Hypokalemia  eICU Interventions  Potassium replaced Will check mag level     Intervention Category Intermediate Interventions: Electrolyte abnormality - evaluation and management  DETERDING,ELIZABETH 07/01/2015, 3:28 AM

## 2015-07-01 NOTE — Progress Notes (Signed)
Called by RN regarding hypokalemia.     Recent Labs Lab 06/29/2015 0345 07/24/2015 0916 07/02/2015 1820 07/01/15 0409 07/01/15 1300  NA 134* 132* 132* 134* 136  K 2.8* 2.6* 2.8* 2.4* 2.6*  CL 94* 94* 94* 94* 98*  CO2 23 26 27 27 27   GLUCOSE 398* 464* 299* 343* 259*  BUN 101* 92* 80* 70* 60*  CREATININE 4.04* 3.03* 2.09* 1.75* 1.42*  CALCIUM 6.9* 6.5* 6.8* 7.0* 7.3*  MG  --   --   --  1.4* 2.5*  PHOS  --   --   --   --  1.3*   Plan: 40 mEq KPhos now  Additional 20 mEq KCL IV  BMP 2200    Canary BrimBrandi Jawuan Robb, NP-C [facility] Pgr: [phone] or if no answer [phone] 07/01/2015, 1:55 PM

## 2015-07-01 NOTE — Progress Notes (Signed)
Pt having auditory and visual hallucinations, attempting to get OOB, pulling at lines/tubes.  Pt confused as to why he is in the hospital.  Safety mitts placed on patient, patient reoriented, and patient repositioned.  Will monitor pt.

## 2015-07-01 NOTE — Progress Notes (Signed)
eLink Physician-Brief Progress Note Patient Name: Derrick JewRobert W Rickel DOB: 08-Jan-1971 MRN: [id number]   Date of Service  07/01/2015  HPI/Events of Note  Patient with hallucinations, pulling at lines/foley.  Concern for ETOH withdrawal.  eICU Interventions  CIWA protocol initiated. Continue to monitor     Intervention Category Major Interventions: Delirium, psychosis, severe agitation - evaluation and management  DETERDING,ELIZABETH 07/01/2015, 11:47 PM

## 2015-07-01 NOTE — Progress Notes (Signed)
eLink Physician-Brief Progress Note Patient Name: Derrick JewRobert W [name] DOB: 19-Apr-1971 MRN: [id number]   Date of Service  07/01/2015  HPI/Events of Note  Hypokalemia and hypomag  eICU Interventions  Potassium replacement ongoing.  Mag replaced     Intervention Category Intermediate Interventions: Electrolyte abnormality - evaluation and management  DETERDING,ELIZABETH 07/01/2015, 4:53 AM

## 2015-07-01 NOTE — Progress Notes (Signed)
CRITICAL VALUE ALERT  Critical value received:  K 2.6  Date of notification:  07/01/15  Time of notification:  1355  Critical value read back:Yes.    Nurse who received alert:  Tory EmeraldMichelle Laqueena Hinchey, RN  MD notified (1st page):  Canary BrimBrandi Ollis, NP  Time of first page:  On floor  MD notified (2nd page):  Time of second page:  Responding MD:  Canary BrimBrandi Ollis, NP  Time MD responded:  On floor

## 2015-07-01 NOTE — Progress Notes (Signed)
[facility] PROGRESS NOTE Subjective Pt more alert, octreotide d/c'd due to problems with glycemic control passed some old blood.   Objective: Vital signs in last 24 hours: Temp:  [97.2 F (36.2 C)-98.7 F (37.1 C)] 98 F (36.7 C) (03/03 1135) Pulse Rate:  [65-92] 92 (03/03 1200) Resp:  [17-21] 17 (03/03 1200) BP: (89-157)/(49-113) 105/65 mmHg (03/03 1200) SpO2:  [91 %-99 %] 97 % (03/03 1200) Weight:  [75.3 kg (166 lb 0.1 oz)] 75.3 kg (166 lb 0.1 oz) (03/03 0500) Last BM Date: 07/05/2015  Intake/Output from previous day: 03/02 0701 - 03/03 0700 In: 5196.4 [I.V.:4261.4; Blood:335; IV Piggyback:600] Out: 5375 [Urine:5375] Intake/Output this shift: Total I/O In: 740 [I.V.:490; IV Piggyback:250] Out: 350 [Urine:350]  PE: General--much more alert   Abdomen--soft and nontender + some ascites  Lab Results:  Recent Labs  07/02/2015 0915 07/20/2015 2014 07/01/15 0409 07/01/15 0945 07/01/15 1300  WBC 15.2* 15.1* 13.4* 12.4* 18.0*  HGB 7.5* 7.7* 7.7* 7.1* 7.8*  HCT 20.9* 22.1* 22.1* 20.6* 22.0*  PLT 64* 60* 61* 58* 75*   BMET  Recent Labs  07/13/2015 0345 07/12/2015 0916 07/10/2015 1820 07/01/15 0409 07/01/15 1300  NA 134* 132* 132* 134* 136  K 2.8* 2.6* 2.8* 2.4* 2.6*  CL 94* 94* 94* 94* 98*  CO2 23 26 27 27 27   CREATININE 4.04* 3.03* 2.09* 1.75* 1.42*   LFT  Recent Labs  09-Jul-2015 1354  PROT 4.2*  AST <5*  ALT <5*  ALKPHOS 52  BILITOT 4.6*   PT/INR  Recent Labs  09-Jul-2015 1354 07/22/2015 0345 07/16/2015 1117  LABPROT 38.6* 26.8* 25.7*  INR 4.08* 2.51* 2.38*   PANCREAS No results for input(s): LIPASE in the last 72 hours.       Studies/Results: Dg Chest Port 1 View  07/17/2015  CLINICAL DATA:  Gastrointestinal tract bleeding, placement of central venous line EXAM: PORTABLE CHEST 1 VIEW COMPARISON:  Chest x-ray of 2015-11-24 FINDINGS: A right IJ central venous line is present with the tip overlying the mid SVC. No pneumothorax is seen. The lungs  are not well aerated with volume loss bilaterally. The heart is within normal limits in size. No bony abnormality is seen. IMPRESSION: Right IJ central venous line tip overlies the mid SVC. No pneumothorax pre Electronically Signed   By: Dwyane DeePaul  Barry M.D.   On: 2015-11-24 16:55    Medications: I have reviewed the patient's current medications.  Assessment/Plan: 1. Acute GI Bleed. Moderate varices were not bleeding but w/o other cause 4 bands placed. Appears stable at present. Will start clears. Off octreotide due to issues with glucose 2. Cirrhosis. Still drinking. Better with lactulose and SBP prophylaxsis with AB. Will give oral Vit K x 5 days due to inc PT 3. Renal Failure. Resolved with fluids doubt HR syndrome  Verna Hamon JR,Shonte Beutler L 07/01/2015, 2:32 PM  This note was created using voice recognition software. Minor errors may Have occurred unintentionally.  Pager: [phone] If no answer or after hours call [phone]

## 2015-07-02 DIAGNOSIS — F1010 Alcohol abuse, uncomplicated: Secondary | ICD-10-CM

## 2015-07-02 DIAGNOSIS — E876 Hypokalemia: Secondary | ICD-10-CM

## 2015-07-02 DIAGNOSIS — F10231 Alcohol dependence with withdrawal delirium: Secondary | ICD-10-CM

## 2015-07-02 DIAGNOSIS — R188 Other ascites: Secondary | ICD-10-CM

## 2015-07-02 DIAGNOSIS — F10239 Alcohol dependence with withdrawal, unspecified: Secondary | ICD-10-CM | POA: Diagnosis present

## 2015-07-02 DIAGNOSIS — F10939 Alcohol use, unspecified with withdrawal, unspecified: Secondary | ICD-10-CM | POA: Diagnosis present

## 2015-07-02 LAB — CBC
HCT: 23 % — ABNORMAL LOW (ref 39.0–52.0)
Hemoglobin: 7.8 g/dL — ABNORMAL LOW (ref 13.0–17.0)
MCH: 32.6 pg (ref 26.0–34.0)
MCHC: 33.9 g/dL (ref 30.0–36.0)
MCV: 96.2 fL (ref 78.0–100.0)
PLATELETS: 70 10*3/uL — AB (ref 150–400)
RBC: 2.39 MIL/uL — AB (ref 4.22–5.81)
RDW: 19.6 % — AB (ref 11.5–15.5)
WBC: 12.6 10*3/uL — AB (ref 4.0–10.5)

## 2015-07-02 LAB — BASIC METABOLIC PANEL
ANION GAP: 10 (ref 5–15)
ANION GAP: 5 (ref 5–15)
ANION GAP: 7 (ref 5–15)
BUN: 38 mg/dL — ABNORMAL HIGH (ref 6–20)
BUN: 27 mg/dL — ABNORMAL HIGH (ref 6–20)
BUN: 27 mg/dL — ABNORMAL HIGH (ref 6–20)
CHLORIDE: 105 mmol/L (ref 101–111)
CHLORIDE: 113 mmol/L — AB (ref 101–111)
CHLORIDE: 111 mmol/L (ref 101–111)
CO2: 27 mmol/L (ref 22–32)
CO2: 26 mmol/L (ref 22–32)
CO2: 28 mmol/L (ref 22–32)
CREATININE: 0.96 mg/dL (ref 0.61–1.24)
Calcium: 7.5 mg/dL — ABNORMAL LOW (ref 8.9–10.3)
Calcium: 6.9 mg/dL — ABNORMAL LOW (ref 8.9–10.3)
Calcium: 7.3 mg/dL — ABNORMAL LOW (ref 8.9–10.3)
Creatinine, Ser: 1.1 mg/dL (ref 0.61–1.24)
Creatinine, Ser: 0.93 mg/dL (ref 0.61–1.24)
GFR calc Af Amer: >60 mL/min (ref >60)
GFR calc Af Amer: >60 mL/min (ref >60)
GFR calc non Af Amer: >60 mL/min (ref >60)
GLUCOSE: 183 mg/dL — AB (ref 65–99)
GLUCOSE: 164 mg/dL — AB (ref 65–99)
Glucose, Bld: 166 mg/dL — ABNORMAL HIGH (ref 65–99)
POTASSIUM: 3.4 mmol/L — AB (ref 3.5–5.1)
POTASSIUM: 7 mmol/L — AB (ref 3.5–5.1)
Potassium: 3.8 mmol/L (ref 3.5–5.1)
SODIUM: 146 mmol/L — AB (ref 135–145)
Sodium: 142 mmol/L (ref 135–145)
Sodium: 144 mmol/L (ref 135–145)

## 2015-07-02 LAB — GLUCOSE, CAPILLARY
GLUCOSE-CAPILLARY: 145 mg/dL — AB (ref 65–99)
GLUCOSE-CAPILLARY: 147 mg/dL — AB (ref 65–99)
GLUCOSE-CAPILLARY: 159 mg/dL — AB (ref 65–99)
GLUCOSE-CAPILLARY: 159 mg/dL — AB (ref 65–99)
GLUCOSE-CAPILLARY: 222 mg/dL — AB (ref 65–99)
Glucose-Capillary: 149 mg/dL — ABNORMAL HIGH (ref 65–99)

## 2015-07-02 LAB — PHOSPHORUS
PHOSPHORUS: 1.3 mg/dL — AB (ref 2.5–4.6)
Phosphorus: <1 mg/dL — CL (ref 2.5–4.6)

## 2015-07-02 LAB — APTT: APTT: 35 s (ref 24–37)

## 2015-07-02 LAB — PROTIME-INR
INR: 2.08 — AB (ref 0.00–1.49)
PROTHROMBIN TIME: 23.3 s — AB (ref 11.6–15.2)

## 2015-07-02 MED ORDER — DEXTROSE 5 % IV SOLN
10.0000 mmol | Freq: Once | INTRAVENOUS | Status: DC
  Filled 2015-07-02: qty 3.33

## 2015-07-02 MED ORDER — VITAMIN K1 10 MG/ML IJ SOLN
10.0000 mg | Freq: Every day | INTRAVENOUS | Status: AC
  Administered 2015-07-02 – 2015-07-04 (×3): 10 mg via INTRAVENOUS
  Filled 2015-07-02 (×3): qty 1

## 2015-07-02 MED ORDER — LACTULOSE ENEMA
300.0000 mL | Freq: Once | RECTAL | Status: AC
Start: 2015-07-02 — End: 2015-07-02
  Administered 2015-07-02: 300 mL via RECTAL
  Filled 2015-07-02 (×2): qty 300

## 2015-07-02 MED ORDER — POTASSIUM CHLORIDE 10 MEQ/50ML IV SOLN
10.0000 meq | INTRAVENOUS | Status: AC
  Administered 2015-07-02 (×3): 10 meq via INTRAVENOUS
  Filled 2015-07-02 (×3): qty 50

## 2015-07-02 MED ORDER — SODIUM CHLORIDE 0.9 % IV SOLN
25.0000 ug/h | INTRAVENOUS | Status: DC
Start: 2015-07-02 — End: 2015-07-03
  Administered 2015-07-02 – 2015-07-03 (×2): 25 ug/h via INTRAVENOUS
  Filled 2015-07-02 (×4): qty 1

## 2015-07-02 NOTE — Progress Notes (Signed)
Repeat K was 3.8. Notified Elink RN and MD in regards to.

## 2015-07-02 NOTE — Progress Notes (Signed)
Patient ID: Derrick [name], male   DOB: [birth year], 45 y.o.   MRN: [id number] [facility]  Derrick [name] 45 y.o. [birth year]   Subjective: Several loose black stools with small amount of red streaks. Tremulous. Aunt at bedside.  Objective: Vital signs in last 24 hours: Filed Vitals:   07/02/15 1100 07/02/15 1223  BP: 134/99   Pulse: 101   Temp:  97.9 F (36.6 C)  Resp: 18     Physical Exam: Gen: lethargic, no acute distress HEENT: anicteric CV: RRR Chest: CTA B Abd: diffuse tenderness, +distention, +BS  Lab Results:  Recent Labs  07/01/15 0409 07/01/15 1300 07/01/15 2050 07/02/15 0545  NA 134* 136 135 142  K 2.4* 2.6* 2.9* 3.4*  CL 94* 98* 97* 105  CO2 27 27 26 27   GLUCOSE 343* 259* 298* 183*  BUN 70* 60* 48* 38*  CREATININE 1.75* 1.42* 1.29* 1.10  CALCIUM 7.0* 7.3* 7.1* 7.5*  MG 1.4* 2.5*  --   --   PHOS  --  1.3*  --   --     Recent Labs  Oct 20, 2015 1354  AST <5*  ALT <5*  ALKPHOS 52  BILITOT 4.6*  PROT 4.2*  ALBUMIN 1.7*    Recent Labs  07/01/15 0945 07/01/15 1300 07/02/15 0925  WBC 12.4* 18.0* 12.6*  NEUTROABS 9.5* 13.4*  --   HGB 7.1* 7.8* 7.8*  HCT 20.6* 22.0* 23.0*  MCV 93.6 93.6 96.2  PLT 58* 75* 70*    Recent Labs  07/02/2015 1117 07/02/15 0925  LABPROT 25.7* 23.3*  INR 2.38* 2.08*      Assessment/Plan: S/P Upper GI bleed likely variceal - s/p band ligation X 4. Suspect residual blood as source of recent bleeding. Will restart Octreotide drip. Change to PPI 40 mg IV Q 12 hours. Keep NPO. Will follow.   Egan Berkheimer C. 07/02/2015, 1:50 PM  Pager [phone]  If no answer or after 5 PM call [phone]

## 2015-07-02 NOTE — Progress Notes (Signed)
Notified Elink RN of critical K of 7.0. Asked to Redraw BMET, not hemolyzed but K was 3.4 prior. MD Vassie LollAlva said to redraw. Will continue to monitor and assess.

## 2015-07-02 NOTE — Progress Notes (Addendum)
Derrick [name] TEAM 1 - Stepdown/ICU TEAM PROGRESS NOTE  Derrick [name] RUE:[id number] DOB: [birth year] DOA: 07/01/2015 PCP: No primary care provider on file.  Admit HPI / Brief Narrative: 45 year old male with Hx ETOH abuse, smoker (1ppd), hypertension, and admission for GI bleed 2/21 > 2/24 with coffee ground emesis x 2-3 days and melena x 1 week. 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 He returned 3/1 after being found by family lethargic and weak with coffee-ground emesis. No red/bloody vomit or stool per family. Still actively drinking. Last drink was 2/28.   Significant Events: 3/1 re-admit w/ coffee ground emesis 3/1 R IJ cortis  3/2 EGD - ?variceal bleeding, 4 bands placed  HPI/Subjective: The patient is confused and agitated.  He is not able to provide a reliable history.  There is no family present at time of my exam.  Assessment/Plan:  Upper GI bleed due to esophageal varices  Hemoglobin has been stable but was not rechecked this morning - CBC now  Acute blood loss anemia  Hgb appears to be holding steady - nadir was 7.1- recheck this AM - transfuse prn to keep Hgb 7.0 or > - s/p 3U PRBC thus far   ETOH cirrhosis - Ascites - Hepatic encephalopathy Follow abdom distention - may require paracentesis - follow ammonia   EtOH withdrawal  CIWA protocol - may require precedex so will keep in 36M for now   Coagulopathy - r/t cirrhosis  Dose w/ scheduled vitamin K and follow - s/p 4U FFP thus far   At risk SBP with GI bleed, cirrhosis  Empiric ceftriaxone for 5 days  Hyperglycemia Felt to be due to octreotide - now off - follow - A1c was 6.2 06/24/15 so appears to have predisposition for DM   Hypotension - resolved  AKI improving with fluids - follow trend   Hypokalemia likely signif total body deficit due to EtOH  abuse - cont to replace and follow - Mg is normal now   Hypophosphatemia  Mild to mod - replace and follow   Code Status: FULL Family Communication: no family present at time of exam Disposition Plan: will not transfer for now as pt likely to require precedex  Consultants: [facility]  Antibiotics: ceftriaxone 3/1 >  DVT prophylaxis: SCDs  Objective: Blood pressure 125/87, pulse 112, temperature 98.4 F (36.9 C), temperature source Oral, resp. rate 23, height 5\' 7"  (1.702 m), weight 75.5 kg (166 lb 7.2 oz), SpO2 100 %.  Intake/Output Summary (Last 24 hours) at 07/02/15 0801 Last data filed at 07/02/15 0700  Gross per 24 hour  Intake   3205 ml  Output   2563 ml  Net    642 ml   Exam: General: No acute respiratory distress - encephalopathic  Lungs: Clear to auscultation bilaterally without wheezes or crackles Cardiovascular: Tachycardic but regular without appreciable murmur or gallop Abdomen: Diffusely tender, moderate ascites, bowel sounds positive, no rebound, no appreciable mass Extremities: No significant cyanosis, clubbing, or edema bilateral lower extremities  Data Reviewed: Basic Metabolic Panel:  Recent Labs Lab 07/29/2015 1820 07/01/15 0409 07/01/15 1300 07/01/15 2050 07/02/15 0545  NA 132* 134* 136 135 142  K 2.8* 2.4* 2.6* 2.9* 3.4*  CL 94* 94* 98* 97* 105  CO2 27 27 27 26 27   GLUCOSE 299* 343* 259* 298* 183*  BUN  80* 70* 60* 48* 38*  CREATININE 2.09* 1.75* 1.42* 1.29* 1.10  CALCIUM 6.8* 7.0* 7.3* 7.1* 7.5*  MG  --  1.4* 2.5*  --   --   PHOS  --   --  1.3*  --   --     CBC:  Recent Labs Lab 07/10/2015 1354  07/05/2015 0915 07/21/2015 2014 07/01/15 0409 07/01/15 0945 07/01/15 1300  WBC 18.8*  < > 15.2* 15.1* 13.4* 12.4* 18.0*  NEUTROABS 13.1*  --   --   --   --  9.5* 13.4*  HGB 4.8*  < > 7.5* 7.7* 7.7* 7.1* 7.8*  HCT 14.3*  < > 20.9* 22.1* 22.1* 20.6* 22.0*  MCV 105.9*  < > 93.7 92.1 93.2 93.6 93.6  PLT 168  < > 64* 60* 61* 58* 75*  < > =  values in this interval not displayed.  Liver Function Tests:  Recent Labs Lab 07/23/2015 1354  AST <5*  ALT <5*  ALKPHOS 52  BILITOT 4.6*  PROT 4.2*  ALBUMIN 1.7*   Recent Labs Lab 07/22/2015 1209  AMMONIA 75*    Coags:  Recent Labs Lab 07/01/2015 1354 07/06/2015 0345 06/29/2015 1117  INR 4.08* 2.51* 2.38*    Recent Labs Lab 07/12/2015 0345  APTT 36   CBG:  Recent Labs Lab 07/01/15 1134 07/01/15 1539 07/01/15 2015 07/01/15 2351 07/02/15 0349  GLUCAP 235* 215* 244* 222* 159*    Recent Results (from the past 240 hour(s))  Urine culture     Status: None   Collection Time: 07/16/2015  2:15 AM  Result Value Ref Range Status   Specimen Description URINE, CLEAN CATCH  Final   Special Requests NONE  Final   Culture NO GROWTH 1 DAY  Final   Report Status 07/01/2015 FINAL  Final     Studies:   Recent x-ray studies have been reviewed in detail by the Attending Physician  Scheduled Meds:  Scheduled Meds: . sodium chloride   Intravenous Once  . sodium chloride   Intravenous Once  . antiseptic oral rinse  7 mL Mouth Rinse q12n4p  . cefTRIAXone (ROCEPHIN)  IV  1 g Intravenous Q24H  . chlorhexidine  15 mL Mouth Rinse BID  . Chlorhexidine Gluconate Cloth  6 each Topical Q0600  . folic acid  1 mg Intravenous Daily  . insulin aspart  0-15 Units Subcutaneous 6 times per day  . lactulose  30 g Oral BID  . mupirocin ointment  1 application Nasal BID  . [START ON 07/03/2015] pantoprazole (PROTONIX) IV  40 mg Intravenous Q12H  . phytonadione  5 mg Oral Daily  . thiamine  100 mg Intravenous Daily    Time spent on care of this patient: 35 mins   Na Waldrip T , MD   [facility] Office  [phone] Pager - Text Page per Loretha Stapler as per below:  On-Call/Text Page:      [email or web address]      password [email or web address]  If 7PM-7AM, please contact night-coverage [email or web address] Password [email or web address] 07/02/2015, 8:01 AM   LOS: 3 days

## 2015-07-02 NOTE — Progress Notes (Signed)
PULMONARY / CRITICAL CARE MEDICINE   Name: Derrick [name] MRN: [id number] DOB: 1971/01/14    ADMISSION DATE:  07/02/2015 CONSULTATION DATE: 3/1  REFERRING MD: EDP  CHIEF COMPLAINT: AMS  BRIEF: 45 y/o male with alcoholic cirrhosis admitted on 3/1 with an upper GI bleed.   SUBJECTIVE:  EGD 3/2 > ? Variceal bleeding, 4 bands placed Still slow to answer questions Family says he is still drinking More withdrawal ETOH last night  >> on CIWA PROTOCOL.    VITAL SIGNS: BP 125/87 mmHg  Pulse 112  Temp(Src) 98.4 F (36.9 C) (Oral)  Resp 23  Ht  (1.702 m)  Wt 166 lb 7.2 oz (75.5 kg)  BMI 26.06 kg/m2  SpO2 100%   PHYSICAL EXAMINATION: General: Chronically ill appearing, disheveled. Confused. Not oriented X 3 HENT: NCAT OP clear PULM: CTAB, normal effort CV: RRR, no mgr GI: distended but soft, buldging flanks Derm: palmar erythema noted Neuro: arouses to voice, does not focus on examiner,moves all four extremities   HEMODYNAMICS:    VENTILATOR SETTINGS:    INTAKE / OUTPUT: I/O last 3 completed shifts: In: 5660 [I.V.:4555; IV Piggyback:1105] Out: 5663 [Urine:5438; Stool:225]   LABS:  BMET  Recent Labs Lab 07/01/15 1300 07/01/15 2050 07/02/15 0545  NA 136 135 142  K 2.6* 2.9* 3.4*  CL 98* 97* 105  CO2 BUN 60* 48* 38*  CREATININE 1.42* 1.29* 1.10  GLUCOSE 259* 298* 183*    Electrolytes  Recent Labs Lab 07/01/15 0409 07/01/15 1300 07/01/15 2050 07/02/15 0545  CALCIUM 7.0* 7.3* 7.1* 7.5*  MG 1.4* 2.5*  --   --   PHOS  --  1.3*  --   --     CBC  Recent Labs Lab 07/01/15 0409 07/01/15 0945 07/01/15 1300  WBC 13.4* 12.4* 18.0*  HGB 7.7* 7.1* 7.8*  HCT 22.1* 20.6* 22.0*  PLT 61* 58* 75*    Coag's  Recent Labs Lab 07/29/2015 1354 07/27/2015 0345 07/23/2015 1117  APTT  --  36  --   INR 4.08* 2.51* 2.38*    Sepsis Markers No results for input(s): LATICACIDVEN, PROCALCITON, O2SATVEN in the last 168 hours.  ABG No  results for input(s): PHART, PCO2ART, PO2ART in the last 168 hours.  Liver Enzymes  Recent Labs Lab 07/16/2015 1354  AST <5*  ALT <5*  ALKPHOS 52  BILITOT 4.6*  ALBUMIN 1.7*    Cardiac Enzymes No results for input(s): TROPONINI, PROBNP in the last 168 hours.  Glucose  Recent Labs Lab 07/01/15 0808 07/01/15 1134 07/01/15 1539 07/01/15 2015 07/01/15 2351 07/02/15 0349  GLUCAP 277* 235* 215* 244* 222* 159*    Imaging No results found.  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 Esophageal varices noted.  EGD 3/2>>> varices noted, no active bleeding, banding performed  CULTURES: BC x 2 3/1>>> Urine culture >> (-)  ANTIBIOTICS: Ceftriaxone 3/1>>>  SIGNIFICANT EVENTS:   LINES/TUBES: R IJ Cortis/CVL 3/1>>>  DISCUSSION: 45yo male with hx ETOH cirrhosis, ongoing ETOH use with recurrent GI bleed and AKI. Persistent alcoholic encephalopathy.  ASSESSMENT / PLAN:  GASTROINTESTINAL Upper GI bleed presumed due to esophageal varices  ETOH cirrhosis  Ascites Hepatic encephalopathy P:  Off vasopressin, octreotide drip Continue protonix gtt  Keep NPO as he is withdraweing.  Cont  lactulose today No narcotics Empiric SBP coverage as below  Monitor for bleeding  NEUROLOGIC ETOH Withdrawal Hepatic encephalopathy ETOH abuse  P:  Thiamine Folate Prn ativan/CIWA protocol. May  need to start precedex if requiring more ativan.  Lactulose bid today> titrate to 3 stools  ENDOCRINE Hyperglycemia > due to octreotide P:  Monitor glucose on chem  Continue insulin SSI for now Discuss stopping octreotide with GI  PULMONARY No active issue  P:  Monitor airway protection closely with UGI bleeding  Supplemental O2 as needed   CARDIOVASCULAR Hypotension - resolved P:  Tele  RENAL AKI - improving with fluids  Hypokalemia P:  Continue IVF> NS with 40mEq KCL Replete K Monitor BMET and UOP Replace  electrolytes as needed  HEMATOLOGIC Blood loss anemia  Coagulopathy - r/t cirrhosis  P:  Repeat CBC -- transfuse if Hb< 7 Check INR  INFECTIOUS At risk SBP with GI bleed, cirrhosis  P:  Empiric ceftriaxone > 5 days    FAMILY  - Updates: No family at bedside. Family updated this am.   Citical caren time spent with this patient this am -- 30 minutes.    Stay inn ICU today  J. Alexis FrockAngelo A de Dios, MD Pulmonary and Critical Care Medicine [facility] Pager: [phone] After 3 pm or if no response, call [phone]  07/02/2015, 8:50 AM

## 2015-07-03 DIAGNOSIS — K2961 Other gastritis with bleeding: Secondary | ICD-10-CM | POA: Diagnosis present

## 2015-07-03 DIAGNOSIS — F10239 Alcohol dependence with withdrawal, unspecified: Secondary | ICD-10-CM

## 2015-07-03 LAB — CBC
HCT: 24.7 % — ABNORMAL LOW (ref 39.0–52.0)
HEMOGLOBIN: 8.2 g/dL — AB (ref 13.0–17.0)
MCH: 33.1 pg (ref 26.0–34.0)
MCHC: 33.2 g/dL (ref 30.0–36.0)
MCV: 99.6 fL (ref 78.0–100.0)
Platelets: 78 10*3/uL — ABNORMAL LOW (ref 150–400)
RBC: 2.48 MIL/uL — ABNORMAL LOW (ref 4.22–5.81)
RDW: 21.3 % — AB (ref 11.5–15.5)
WBC: 15.4 10*3/uL — ABNORMAL HIGH (ref 4.0–10.5)

## 2015-07-03 LAB — TYPE AND SCREEN
ABO/RH(D): A POS
Antibody Screen: NEGATIVE
UNIT DIVISION: 0
Unit division: 0
Unit division: 0
Unit division: 0
Unit division: 0

## 2015-07-03 LAB — COMPREHENSIVE METABOLIC PANEL WITH GFR
ALT: 173 U/L — ABNORMAL HIGH (ref 17–63)
AST: 254 U/L — ABNORMAL HIGH (ref 15–41)
Albumin: 2.4 g/dL — ABNORMAL LOW (ref 3.5–5.0)
Alkaline Phosphatase: 68 U/L (ref 38–126)
Anion gap: 12 (ref 5–15)
BUN: 21 mg/dL — ABNORMAL HIGH (ref 6–20)
CO2: 24 mmol/L (ref 22–32)
Calcium: 7.6 mg/dL — ABNORMAL LOW (ref 8.9–10.3)
Chloride: 111 mmol/L (ref 101–111)
Creatinine, Ser: 1.01 mg/dL (ref 0.61–1.24)
GFR calc Af Amer: >60 mL/min
GFR calc non Af Amer: >60 mL/min
Glucose, Bld: 176 mg/dL — ABNORMAL HIGH (ref 65–99)
Potassium: 3.9 mmol/L (ref 3.5–5.1)
Sodium: 147 mmol/L — ABNORMAL HIGH (ref 135–145)
Total Bilirubin: 9.9 mg/dL — ABNORMAL HIGH (ref 0.3–1.2)
Total Protein: 5.7 g/dL — ABNORMAL LOW (ref 6.5–8.1)

## 2015-07-03 LAB — GLUCOSE, CAPILLARY
GLUCOSE-CAPILLARY: 141 mg/dL — AB (ref 65–99)
GLUCOSE-CAPILLARY: 169 mg/dL — AB (ref 65–99)
Glucose-Capillary: 125 mg/dL — ABNORMAL HIGH (ref 65–99)
Glucose-Capillary: 126 mg/dL — ABNORMAL HIGH (ref 65–99)
Glucose-Capillary: 163 mg/dL — ABNORMAL HIGH (ref 65–99)
Glucose-Capillary: 165 mg/dL — ABNORMAL HIGH (ref 65–99)

## 2015-07-03 LAB — PROTIME-INR
INR: 1.95 — AB (ref 0.00–1.49)
Prothrombin Time: 22.1 seconds — ABNORMAL HIGH (ref 11.6–15.2)

## 2015-07-03 LAB — MAGNESIUM: Magnesium: 1.7 mg/dL (ref 1.7–2.4)

## 2015-07-03 LAB — PHOSPHORUS: Phosphorus: 1.7 mg/dL — ABNORMAL LOW (ref 2.5–4.6)

## 2015-07-03 LAB — AMMONIA: Ammonia: 25 umol/L (ref 9–35)

## 2015-07-03 MED ORDER — PANTOPRAZOLE SODIUM 40 MG IV SOLR
40.0000 mg | Freq: Two times a day (BID) | INTRAVENOUS | Status: DC
  Administered 2015-07-03 – 2015-07-08 (×11): 40 mg via INTRAVENOUS
  Filled 2015-07-03 (×17): qty 40

## 2015-07-03 NOTE — Progress Notes (Signed)
PULMONARY / CRITICAL CARE MEDICINE   Name: Derrick [name] MRN: [id number] DOB: Aug 21, 1970    ADMISSION DATE:  07/18/2015   CONSULTATION DATE: 3/1  REFERRING MD: EDP  CHIEF COMPLAINT: AMS  BRIEF: 45 y/o male with alcoholic cirrhosis admitted on 3/1 with an upper GI bleed.   SUBJECTIVE:  EGD 3/2 > ? Variceal bleeding, 4 bands placed Less agitated. Still on ciwa protocol.    VITAL SIGNS: BP 153/84 mmHg  Pulse 104  Temp(Src) 98.2 F (36.8 C) (Oral)  Resp 23  Ht 5\' 7"  (1.702 m)  Wt 166 lb 7.2 oz (75.5 kg)  BMI 26.06 kg/m2  SpO2 77%  HEMODYNAMICS:    VENTILATOR SETTINGS:    INTAKE / OUTPUT: I/O last 3 completed shifts: In: 3622.1 [I.V.:2987.1; IV Piggyback:635] Out: 2188 [WUJWJ:1914[Urine:1963; Stool:225]  PHYSICAL EXAMINATION: General: Chronically ill appearing, disheveled. Oriented x 3. Follows commands.  HENT: NCAT OP clear PULM: CTAB, normal effort  Some bibasilar crackles.  CV: RRR, no mgr GI: distended but soft, buldging flanks Derm: palmar erythema noted Neuro: arouses to voice, does not focus on examiner,moves all four extremities   LABS:  BMET  Recent Labs Lab 07/02/15 1517 07/02/15 1634 07/03/15 0545  NA 144 146* 147*  K 7.0* 3.8 3.9  CL 113* 111 111  CO2 26 28 24   BUN 27* 27* 21*  CREATININE 0.93 0.96 1.01  GLUCOSE 164* 166* 176*    Electrolytes  Recent Labs Lab 07/01/15 0409  07/01/15 1300  07/02/15 1517 07/02/15 1634 07/03/15 0545  CALCIUM 7.0*  --  7.3*  < > 6.9* 7.3* 7.6*  MG 1.4*  --  2.5*  --   --   --  1.7  PHOS  --   < > 1.3*  --  <1.0* 1.3* 1.7*  < > = values in this interval not displayed.  CBC  Recent Labs Lab 07/01/15 1300 07/02/15 0925 07/03/15 0545  WBC 18.0* 12.6* 15.4*  HGB 7.8* 7.8* 8.2*  HCT 22.0* 23.0* 24.7*  PLT 75* 70* 78*    Coag's  Recent Labs Lab 07/10/2015 0345 07/13/2015 1117 07/02/15 0925 07/03/15 0545  APTT 36  --  35  --   INR 2.51* 2.38* 2.08* 1.95*    Sepsis Markers No results  for input(s): LATICACIDVEN, PROCALCITON, O2SATVEN in the last 168 hours.  ABG No results for input(s): PHART, PCO2ART, PO2ART in the last 168 hours.  Liver Enzymes  Recent Labs Lab 07/06/2015 1354 07/03/15 0545  AST <5* 254*  ALT <5* 173*  ALKPHOS 52 68  BILITOT 4.6* 9.9*  ALBUMIN 1.7* 2.4*    Cardiac Enzymes No results for input(s): TROPONINI, PROBNP in the last 168 hours.  Glucose  Recent Labs Lab 07/02/15 1632 07/02/15 1945 07/02/15 2348 07/03/15 0408 07/03/15 0841 07/03/15 1219  GLUCAP 147* 145* 126* 125* 141* 169*    Imaging No results found.   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 Esophageal varices noted.  EGD 3/2>>> varices noted, no active bleeding, banding performed  CULTURES: BC x 2 3/1>>> Urine culture >> (-)  ANTIBIOTICS: Ceftriaxone 3/1>>>  SIGNIFICANT EVENTS:   LINES/TUBES: R IJ Cortis/CVL 3/1>>>  DISCUSSION: 45yo male with hx ETOH cirrhosis, ongoing ETOH use with recurrent GI bleed and AKI. Persistent alcoholic encephalopathy.  ASSESSMENT / PLAN:  GASTROINTESTINAL Upper GI bleed presumed due to esophageal varices  ETOH cirrhosis  Ascites Hepatic encephalopathy P:  Off vasopressin. No bleeding. D/c octreotide today.  Continue protonix IV May start clears  as sensorium is better.   Cont lactulose today No narcotics Empiric SBP coverage as below -- will d/c today (D5).  Monitor for bleeding Observe LFTs. No abd pain. Trial with clears.   NEUROLOGIC ETOH Withdrawal Hepatic encephalopathy ETOH abuse  P:  Thiamine Folate Prn ativan/CIWA protocol. May need to start precedex if requiring more ativan.  Lactulose bid today> titrate to 3 stools  ENDOCRINE Hyperglycemia > due to octreotide P:  Monitor glucose on chem  Continue insulin SSI for now   PULMONARY No active issue  P:  Monitor airway protection closely with UGI bleeding  Supplemental O2 as needed    CARDIOVASCULAR Hypotension - resolved P:  Tele  RENAL AKI - improving with fluids   P:  Continue IVF> NS with KCL 50 ml/hr. D/C IVF once eating Monitor BMET and UOP Replace electrolytes as needed  HEMATOLOGIC Blood loss anemia  Coagulopathy - r/t cirrhosis  P:  Repeat CBC -- transfuse if Hb< 7 Check INR  INFECTIOUS At risk SBP with GI bleed, cirrhosis  P:  Empiric ceftriaxone >> finished 5 days.     FAMILY  - Updates: no family at bedside  Plan for SD in am. Will go to [facility] in am. Dr.  Reather Littler. D/w Dr. Sharon Seller.   Pollie Meyer, MD Pulmonary and Critical Care Medicine [facility] Pager: [phone] After 3 pm or if no response, call [phone]  07/03/2015, 1:17 PM

## 2015-07-03 NOTE — Progress Notes (Signed)
Patient ID: Derrick [name], male   DOB: 07-07-1970, 45 y.o.   MRN: [id number] [facility]  Derrick [name] 44 y.o. 07-07-1970   Subjective: No bleeding overnight. Confused.  Objective: Vital signs in last 24 hours: Filed Vitals:   07/03/15 1200 07/03/15 1220  BP: 153/84   Pulse: 104   Temp:  98.2 F (36.8 C)  Resp: 23     Physical Exam: Gen: lethargic, confused CV: RRR Chest: CTA B Abd: +distention, diffusely tender, +BS Ext: no edema   Lab Results:  Recent Labs  07/01/15 1300  07/02/15 1634 07/03/15 0545  NA 136  < > 146* 147*  K 2.6*  < > 3.8 3.9  CL 98*  < > 111 111  CO2 27  < > 28 24  GLUCOSE 259*  < > 166* 176*  BUN 60*  < > 27* 21*  CREATININE 1.42*  < > 0.96 1.01  CALCIUM 7.3*  < > 7.3* 7.6*  MG 2.5*  --   --  1.7  PHOS 1.3*  < > 1.3* 1.7*  < > = values in this interval not displayed.  Recent Labs  07/03/15 0545  AST 254*  ALT 173*  ALKPHOS 68  BILITOT 9.9*  PROT 5.7*  ALBUMIN 2.4*    Recent Labs  07/01/15 0945 07/01/15 1300 07/02/15 0925 07/03/15 0545  WBC 12.4* 18.0* 12.6* 15.4*  NEUTROABS 9.5* 13.4*  --   --   HGB 7.1* 7.8* 7.8* 8.2*  HCT 20.6* 22.0* 23.0* 24.7*  MCV 93.6 93.6 96.2 99.6  PLT 58* 75* 70* 78*    Recent Labs  07/02/15 0925 07/03/15 0545  LABPROT 23.3* 22.1*  INR 2.08* 1.95*      Assessment/Plan: S/P Upper GI bleed likely variceal - s/p band ligation X 4. No further bleeding. Will d/c Octreotide drip. Continue IV PPI Q 12 hours. NPO until mental status improves. Continue Lactulose. Will sign off. Call if questions.  Kyriaki Moder C. 07/03/2015, 1:11 PM  Pager [phone]  If no answer or after 5 PM call [phone]

## 2015-07-04 DIAGNOSIS — R410 Disorientation, unspecified: Secondary | ICD-10-CM

## 2015-07-04 LAB — GLUCOSE, CAPILLARY
Glucose-Capillary: 109 mg/dL — ABNORMAL HIGH (ref 65–99)
Glucose-Capillary: 121 mg/dL — ABNORMAL HIGH (ref 65–99)
Glucose-Capillary: 126 mg/dL — ABNORMAL HIGH (ref 65–99)
Glucose-Capillary: 136 mg/dL — ABNORMAL HIGH (ref 65–99)
Glucose-Capillary: 151 mg/dL — ABNORMAL HIGH (ref 65–99)
Glucose-Capillary: 154 mg/dL — ABNORMAL HIGH (ref 65–99)

## 2015-07-04 LAB — HEPATIC FUNCTION PANEL
ALK PHOS: 70 U/L (ref 38–126)
ALT: 150 U/L — AB (ref 17–63)
AST: 177 U/L — AB (ref 15–41)
Albumin: 2.3 g/dL — ABNORMAL LOW (ref 3.5–5.0)
BILIRUBIN INDIRECT: 4.4 mg/dL — AB (ref 0.3–0.9)
Bilirubin, Direct: 6.5 mg/dL — ABNORMAL HIGH (ref 0.1–0.5)
TOTAL PROTEIN: 5.4 g/dL — AB (ref 6.5–8.1)
Total Bilirubin: 10.9 mg/dL — ABNORMAL HIGH (ref 0.3–1.2)

## 2015-07-04 MED ORDER — CETYLPYRIDINIUM CHLORIDE 0.05 % MT LIQD
7.0000 mL | Freq: Two times a day (BID) | OROMUCOSAL | Status: DC
  Administered 2015-07-04 – 2015-07-07 (×6): 7 mL via OROMUCOSAL

## 2015-07-04 NOTE — Progress Notes (Signed)
[place] TEAM 1 - Stepdown/ICU TEAM PROGRESS NOTE  BENJAMINE STROUT UXL:[id number] DOB: [birth year] DOA: 07/12/2015 PCP: No primary care provider on file.  Admit HPI / Brief Narrative: 45 year old male with Hx ETOH abuse, smoker (1ppd), hypertension, and admission for GI bleed 2/21 > 2/24 with coffee ground emesis x 2-3 days and melena x 1 week. 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 He returned 3/1 after being found by family lethargic and weak with coffee-ground emesis. No red/bloody vomit or stool per family. Still actively drinking. Last drink was 2/28.   Significant Events: 3/1 re-admit w/ coffee ground emesis 3/1 R IJ cortis  3/2 EGD - ?variceal bleeding, 4 bands placed  HPI/Subjective: The patient remains delirious and can provide no history.  He is jaundiced.  He does not appear to be uncomfortable or in respiratory distress.  Assessment/Plan:  Upper GI bleed due to esophageal varices  S/p variceal banding x4 3/2 - bleeding recurred 3/4 necessitating return to octreotide drip - now back off octreotide with no evidence of ongoing bleeding - continue PPI  Acute blood loss anemia  Hgb appears to be holding steady - nadir was 7.1- transfuse prn to keep Hgb 7.0 or > - s/p 3U PRBC thus far   ETOH cirrhosis - Ascites - Hepatic encephalopathy Ammonia normalized as of 3/5   EtOH withdrawal  CIWA protocol to continue in SDU setting   Coagulopathy - r/t cirrhosis  w/ scheduled vitamin K and 4U FFP his INR has improved to ~2 - follow   At risk SBP with GI bleed, cirrhosis  Empiric ceftriaxone dosed for 5 days  Hyperglycemia Felt to be due to octreotide - A1c was 6.2 06/24/15 so appears to have predisposition for DM - reasonably controlled at this time   Hypotension - resolved  AKI resoled with fluids    Hypokalemia likely signif total body deficit due to EtOH abuse - cont to replace as needed and follow - Mg is normal  Hypophosphatemia  Mild to mod - cont to replace and follow   Code Status: FULL Family Communication: no family present at time of exam Disposition Plan: will not transfer for now as pt likely to require precedex  Consultants: [facility]  Antibiotics: ceftriaxone 3/1 > 3/5  DVT prophylaxis: SCDs  Objective: Blood pressure 82/66, pulse 113, temperature 98 F (36.7 C), temperature source Oral, resp. rate 17, height  (1.702 m), weight 75.1 kg (165 lb 9.1 oz), SpO2 100 %.  Intake/Output Summary (Last 24 hours) at 07/04/15 0943 Last data filed at 07/04/15 0700  Gross per 24 hour  Intake 1307.5 ml  Output    225 ml  Net 1082.5 ml   Exam: General: No acute respiratory distress - obtunded  Lungs: Clear to auscultation bilaterally  Cardiovascular: Tachycardic but regular without murmur or gallop Abdomen: moderate ascites, bowel sounds positive, no rebound, no appreciable mass, soft Extremities: No significant cyanosis, clubbing, edema bilateral lower extremities  Data Reviewed: Basic Metabolic Panel:  Recent Labs Lab 07/01/15 0409 07/01/15 1300 07/01/15 2050 07/02/15 0545 07/02/15 1517 07/02/15 1634 07/03/15 0545  NA 134* 136 135 142 144 146* 147*  K 2.4* 2.6* 2.9* 3.4* 7.0* 3.8 3.9  CL 94* 98* 97* 105 113* 111 111  CO2 GLUCOSE 343* 259* 298*  183* 164* 166* 176*  BUN 70* 60* 48* 38* 27* 27* 21*  CREATININE 1.75* 1.42* 1.29* 1.10 0.93 0.96 1.01  CALCIUM 7.0* 7.3* 7.1* 7.5* 6.9* 7.3* 7.6*  MG 1.4* 2.5*  --   --   --   --  1.7  PHOS  --  1.3*  --   --  <1.0* 1.3* 1.7*    CBC:  Recent Labs Lab 12-17-2015 1354  07/01/15 0409 07/01/15 0945 07/01/15 1300 07/02/15 0925 07/03/15 0545  WBC 18.8*  < > 13.4* 12.4* 18.0* 12.6* 15.4*  NEUTROABS 13.1*  --   --  9.5* 13.4*  --   --   HGB 4.8*  < > 7.7* 7.1* 7.8* 7.8* 8.2*   HCT 14.3*  < > 22.1* 20.6* 22.0* 23.0* 24.7*  MCV 105.9*  < > 93.2 93.6 93.6 96.2 99.6  PLT 168  < > 61* 58* 75* 70* 78*  < > = values in this interval not displayed.  Liver Function Tests:  Recent Labs Lab 12-17-2015 1354 07/03/15 0545 07/04/15 0543  AST <5* 254* 177*  ALT <5* 173* 150*  ALKPHOS 52 68 70  BILITOT 4.6* 9.9* 10.9*  PROT 4.2* 5.7* 5.4*  ALBUMIN 1.7* 2.4* 2.3*    Recent Labs Lab 07/15/2015 1209 07/03/15 0545  AMMONIA 75* 25    Coags:  Recent Labs Lab 12-17-2015 1354 07/21/2015 0345 07/29/2015 1117 07/02/15 0925 07/03/15 0545  INR 4.08* 2.51* 2.38* 2.08* 1.95*    Recent Labs Lab 07/15/2015 0345 07/02/15 0925  APTT 36 35   CBG:  Recent Labs Lab 07/03/15 1219 07/03/15 1628 07/03/15 2051 07/04/15 0022 07/04/15 0348  GLUCAP 169* 163* 165* 151* 126*    Recent Results (from the past 240 hour(s))  Urine culture     Status: None   Collection Time: 07/18/2015  2:15 AM  Result Value Ref Range Status   Specimen Description URINE, CLEAN CATCH  Final   Special Requests NONE  Final   Culture NO GROWTH 1 DAY  Final   Report Status 07/01/2015 FINAL  Final     Studies:   Recent x-ray studies have been reviewed in detail by the Attending Physician  Scheduled Meds:  Scheduled Meds: . folic acid  1 mg Intravenous Daily  . insulin aspart  0-15 Units Subcutaneous 6 times per day  . lactulose  30 g Oral BID  . mupirocin ointment  1 application Nasal BID  . pantoprazole (PROTONIX) IV  40 mg Intravenous Q12H  . phytonadione (VITAMIN K) IV  10 mg Intravenous Daily  . potassium phosphate IVPB (mmol)  10 mmol Intravenous Once  . thiamine  100 mg Intravenous Daily    Time spent on care of this patient: 35 mins   Sunnie Odden T , MD   [facility] Office  [phone] Pager - Text Page per Loretha StaplerAmion as per below:  On-Call/Text Page:      [email or web address]      password [email or web address]  If 7PM-7AM, please contact night-coverage [email or web address] Password  [email or web address] 07/04/2015, 9:43 AM   LOS: 5 days

## 2015-07-04 NOTE — Progress Notes (Signed)
Handing off pt at this time.  [facility].  Pt has no s/s of any acute distress.  Family at bedside.

## 2015-07-05 ENCOUNTER — Inpatient Hospital Stay (HOSPITAL_COMMUNITY): Payer: Medicaid Other

## 2015-07-05 DIAGNOSIS — J438 Other emphysema: Secondary | ICD-10-CM

## 2015-07-05 DIAGNOSIS — K2921 Alcoholic gastritis with bleeding: Secondary | ICD-10-CM

## 2015-07-05 DIAGNOSIS — G9340 Encephalopathy, unspecified: Secondary | ICD-10-CM | POA: Diagnosis present

## 2015-07-05 DIAGNOSIS — Z720 Tobacco use: Secondary | ICD-10-CM

## 2015-07-05 DIAGNOSIS — R739 Hyperglycemia, unspecified: Secondary | ICD-10-CM | POA: Diagnosis present

## 2015-07-05 DIAGNOSIS — J449 Chronic obstructive pulmonary disease, unspecified: Secondary | ICD-10-CM | POA: Diagnosis present

## 2015-07-05 DIAGNOSIS — N179 Acute kidney failure, unspecified: Secondary | ICD-10-CM | POA: Diagnosis present

## 2015-07-05 DIAGNOSIS — F1010 Alcohol abuse, uncomplicated: Secondary | ICD-10-CM | POA: Diagnosis present

## 2015-07-05 LAB — GLUCOSE, CAPILLARY
GLUCOSE-CAPILLARY: 132 mg/dL — AB (ref 65–99)
GLUCOSE-CAPILLARY: 132 mg/dL — AB (ref 65–99)
GLUCOSE-CAPILLARY: 130 mg/dL — AB (ref 65–99)
Glucose-Capillary: 117 mg/dL — ABNORMAL HIGH (ref 65–99)
Glucose-Capillary: 135 mg/dL — ABNORMAL HIGH (ref 65–99)
Glucose-Capillary: 147 mg/dL — ABNORMAL HIGH (ref 65–99)

## 2015-07-05 LAB — CBC
HCT: 27.8 % — ABNORMAL LOW (ref 39.0–52.0)
Hemoglobin: 8.6 g/dL — ABNORMAL LOW (ref 13.0–17.0)
MCH: 32.3 pg (ref 26.0–34.0)
MCHC: 30.9 g/dL (ref 30.0–36.0)
MCV: 104.5 fL — ABNORMAL HIGH (ref 78.0–100.0)
Platelets: 94 10*3/uL — ABNORMAL LOW (ref 150–400)
RBC: 2.66 MIL/uL — ABNORMAL LOW (ref 4.22–5.81)
RDW: 23.9 % — ABNORMAL HIGH (ref 11.5–15.5)
WBC: 13.2 10*3/uL — ABNORMAL HIGH (ref 4.0–10.5)

## 2015-07-05 LAB — COMPREHENSIVE METABOLIC PANEL
ALBUMIN: 2.3 g/dL — AB (ref 3.5–5.0)
ALT: 127 U/L — AB (ref 17–63)
AST: 121 U/L — AB (ref 15–41)
Alkaline Phosphatase: 76 U/L (ref 38–126)
Anion gap: 8 (ref 5–15)
BUN: 18 mg/dL (ref 6–20)
CALCIUM: 7.8 mg/dL — AB (ref 8.9–10.3)
CHLORIDE: 127 mmol/L — AB (ref 101–111)
CO2: 23 mmol/L (ref 22–32)
CREATININE: 0.93 mg/dL (ref 0.61–1.24)
GFR calc non Af Amer: >60 mL/min (ref >60)
GLUCOSE: 162 mg/dL — AB (ref 65–99)
Potassium: 4.7 mmol/L (ref 3.5–5.1)
SODIUM: 158 mmol/L — AB (ref 135–145)
Total Bilirubin: 10.8 mg/dL — ABNORMAL HIGH (ref 0.3–1.2)
Total Protein: 6 g/dL — ABNORMAL LOW (ref 6.5–8.1)

## 2015-07-05 LAB — PHOSPHORUS: Phosphorus: 2.3 mg/dL — ABNORMAL LOW (ref 2.5–4.6)

## 2015-07-05 LAB — PROTIME-INR
INR: 2 — AB (ref 0.00–1.49)
PROTHROMBIN TIME: 22.6 s — AB (ref 11.6–15.2)

## 2015-07-05 LAB — AMMONIA: AMMONIA: 21 umol/L (ref 9–35)

## 2015-07-05 MED ORDER — INSULIN ASPART 100 UNIT/ML ~~LOC~~ SOLN
0.0000 [IU] | SUBCUTANEOUS | Status: DC
  Administered 2015-07-05 – 2015-07-06 (×4): 3 [IU] via SUBCUTANEOUS
  Administered 2015-07-06: 4 [IU] via SUBCUTANEOUS
  Administered 2015-07-06 – 2015-07-07 (×8): 3 [IU] via SUBCUTANEOUS
  Administered 2015-07-07 – 2015-07-08 (×3): 4 [IU] via SUBCUTANEOUS
  Administered 2015-07-08: 7 [IU] via SUBCUTANEOUS

## 2015-07-05 MED ORDER — DEXTROSE 5 % IV SOLN
INTRAVENOUS | Status: DC
  Administered 2015-07-05 – 2015-07-08 (×7): via INTRAVENOUS

## 2015-07-05 NOTE — Progress Notes (Signed)
[place] TEAM 1 - Stepdown/ICU TEAM Progress Note  Derrick [name]:[id number] DOB: July 08, 1970 DOA: 07/19/2015 PCP: No primary care provider on file.  Admit HPI / Brief Narrative: 45 year old WM PMHx ETOH abuse, Tobacco Abuse smoker (1ppd), COPD, HTN, GI bleed,   Admission for GI bleed 2/21 > 2/24 with coffee ground emesis x 2-3 days and melena x 1 week. 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 He returned 3/1 after being found by family lethargic and weak with coffee-ground emesis. No red/bloody vomit or stool per family. Still actively drinking. Last drink was 2/28.    HPI/Subjective: 3/7 A/O 2 (does not know where, why), confused, cachectic, follow some commands.      Assessment/Plan:  Upper GI bleed due to esophageal varices  -S/p variceal banding x4  3/2 - bleeding recurred 3/4 necessitating return to octreotide drip, now back off octreotide with no evidence of ongoing bleeding  - continue Protonix 40 mg BID  Acute blood loss anemia  -Hgb appears to be holding steady - nadir was 7.1 - s/p s/p transfused 3U PRBC  -Transfuse for hemoglobin<7   Acute Encephalopathy -Most likely multifactorial to include alcohol withdrawal, hepatic encephalopathy, and metabolic encephalopathy -Ammonia normalized -Patient still has multiple metabolic abnormalities, and alcohol withdrawal?  ETOH cirrhosis - Ascites - Hepatic encephalopathy -Ammonia normalized as of 3/5   EtOH withdrawal  -CIWA protocol to continue in SDU setting   Coagulopathy - r/t cirrhosis  -w/ scheduled vitamin K and 4U FFP his INR has improved to ~2 - follow   At risk SBP with GI bleed, cirrhosis  -Empiric ceftriaxone dosed for 5 days  Hyperglycemia Felt to be due to octreotide - A1c was 6.2 06/24/15 so appears to have predisposition for DM -  reasonably controlled at this time   Hypotension - resolved  AKI Resolved with fluids   Hypernatremia -D5W 64ml/hr -Start resistant SSI, secondary to starting D5W for hypernatremia   Hypocalcemia  Hypokalemia likely signif total body deficit due to EtOH abuse - cont to replace as needed and follow - Mg is normal  Hypophosphatemia  Mild to mod - cont to replace and follow   UTI? -Obtain urinalysis and urine culture     Code Status: FULL Family Communication: no family present at time of exam Disposition Plan: Resolution encephalopathy    Consultants: [facility]  Procedure/Significant Events: 3/1 re-admit w/ coffee ground emesis 3/1 R IJ cortis  3/2 EGD - ?variceal bleeding, 4 bands placed  s/p transfused 3U PRBC    Culture 3/7 urine pending   Antibiotics: ceftriaxone 3/1 > 3/5  DVT prophylaxis: SCDs   Devices    LINES / TUBES:      Continuous Infusions: . dextrose 75 mL/hr at 07/05/15 1751    Objective: VITAL SIGNS: Temp: 99 F (37.2 C) (03/07 2017) Temp Source: Oral (03/07 2017) BP: 120/95 mmHg (03/07 1900) Pulse Rate: 54 (03/07 1900) SPO2; FIO2:   Intake/Output Summary (Last 24 hours) at 07/05/15 2143 Last data filed at 07/05/15 1900  Gross per 24 hour  Intake 1206.25 ml  Output    360 ml  Net 846.25 ml     Exam: General:A/O 2 (does not know where, why), confused, cachectic, follow some commands , No acute respiratory distress Eyes: Negative headache, negative scleral hemorrhage ENT: Negative Runny nose, negative gingival  bleeding, Neck:  Negative scars, masses, torticollis, lymphadenopathy, JVD Lungs: Clear to auscultation bilaterally without wheezes or crackles Cardiovascular: Regular rate and rhythm without murmur gallop or rub normal S1 and S2 Abdomen:negative abdominal pain, distended, positive soft, bowel sounds, no rebound, no ascites, no appreciable mass GU: Patient urine is extremely dirty, pyuria?,  Hematuria? Extremities: No significant cyanosis, clubbing, or edema bilateral lower extremities Psychiatric:  Negative depression, negative anxiety, negative fatigue, negative mania  Neurologic:  Cranial nerves II through XII intact, tongue/uvula midline, all extremities muscle strength 5/5, sensation intact throughout, negative dysarthria, negative expressive aphasia, negative receptive aphasia.   Data Reviewed: Basic Metabolic Panel:  Recent Labs Lab 07/01/15 0409 07/01/15 1300  07/02/15 0545 07/02/15 1517 07/02/15 1634 07/03/15 0545 07/05/15 0522  NA 134* 136  < > 142 144 146* 147* 158*  K 2.4* 2.6*  < > 3.4* 7.0* 3.8 3.9 4.7  CL 94* 98*  < > 105 113* 111 111 127*  CO2 27 27  < > 27 26 28 24 23   GLUCOSE 343* 259*  < > 183* 164* 166* 176* 162*  BUN 70* 60*  < > 38* 27* 27* 21* 18  CREATININE 1.75* 1.42*  < > 1.10 0.93 0.96 1.01 0.93  CALCIUM 7.0* 7.3*  < > 7.5* 6.9* 7.3* 7.6* 7.8*  MG 1.4* 2.5*  --   --   --   --  1.7  --   PHOS  --  1.3*  --   --  <1.0* 1.3* 1.7* 2.3*  < > = values in this interval not displayed. Liver Function Tests:  Recent Labs Lab 07/14/2015 1354 07/03/15 0545 07/04/15 0543 07/05/15 0522  AST <5* 254* 177* 121*  ALT <5* 173* 150* 127*  ALKPHOS 52 68 70 76  BILITOT 4.6* 9.9* 10.9* 10.8*  PROT 4.2* 5.7* 5.4* 6.0*  ALBUMIN 1.7* 2.4* 2.3* 2.3*   No results for input(s): LIPASE, AMYLASE in the last 168 hours.  Recent Labs Lab 22-Feb-2016 1209 07/03/15 0545 07/05/15 0525  AMMONIA 75* 25 21   CBC:  Recent Labs Lab 07/25/2015 1354  07/01/15 0945 07/01/15 1300 07/02/15 0925 07/03/15 0545 07/05/15 0522  WBC 18.8*  < > 12.4* 18.0* 12.6* 15.4* 13.2*  NEUTROABS 13.1*  --  9.5* 13.4*  --   --   --   HGB 4.8*  < > 7.1* 7.8* 7.8* 8.2* 8.6*  HCT 14.3*  < > 20.6* 22.0* 23.0* 24.7* 27.8*  MCV 105.9*  < > 93.6 93.6 96.2 99.6 104.5*  PLT 168  < > 58* 75* 70* 78* 94*  < > = values in this interval not displayed. Cardiac Enzymes: No results for  input(s): CKTOTAL, CKMB, CKMBINDEX, TROPONINI in the last 168 hours. BNP (last 3 results) No results for input(s): BNP in the last 8760 hours.  ProBNP (last 3 results) No results for input(s): PROBNP in the last 8760 hours.  CBG:  Recent Labs Lab 07/05/15 0340 07/05/15 0816 07/05/15 1147 07/05/15 1547 07/05/15 2015  GLUCAP 132* 132* 117* 130* 147*    Recent Results (from the past 240 hour(s))  Urine culture     Status: None   Collection Time: 22-Feb-2016  2:15 AM  Result Value Ref Range Status   Specimen Description URINE, CLEAN CATCH  Final   Special Requests NONE  Final   Culture NO GROWTH 1 DAY  Final   Report Status 07/01/2015 FINAL  Final     Studies:  Recent x-ray studies have been reviewed in detail  by the Attending Physician  Scheduled Meds:  Scheduled Meds: . antiseptic oral rinse  7 mL Mouth Rinse BID  . folic acid  1 mg Intravenous Daily  . insulin aspart  0-20 Units Subcutaneous 6 times per day  . lactulose  30 g Oral BID  . pantoprazole (PROTONIX) IV  40 mg Intravenous Q12H  . thiamine  100 mg Intravenous Daily    Time spent on care of this patient: 40 mins   Delma Villalva, Roselind Messier , MD  [facility] Office  [phone] Pager - [phone]  On-Call/Text Page:      [email or web address]      password [email or web address]  If 7PM-7AM, please contact night-coverage [email or web address] Password [email or web address] 07/05/2015, 9:43 PM   LOS: 6 days   Care during the described time interval was provided by me .  I have reviewed this patient's available data, including medical history, events of note, physical examination, and all test results as part of my evaluation. I have personally reviewed and interpreted all radiology studies.   Carolyne Littles, MD [phone] Pager

## 2015-07-06 ENCOUNTER — Inpatient Hospital Stay (HOSPITAL_COMMUNITY): Payer: Medicaid Other

## 2015-07-06 DIAGNOSIS — R739 Hyperglycemia, unspecified: Secondary | ICD-10-CM

## 2015-07-06 LAB — PROTIME-INR
INR: 1.94 — AB (ref 0.00–1.49)
PROTHROMBIN TIME: 22 s — AB (ref 11.6–15.2)

## 2015-07-06 LAB — COMPREHENSIVE METABOLIC PANEL
ALT: 111 U/L — ABNORMAL HIGH (ref 17–63)
ANION GAP: 11 (ref 5–15)
AST: 98 U/L — ABNORMAL HIGH (ref 15–41)
Albumin: 2.2 g/dL — ABNORMAL LOW (ref 3.5–5.0)
Alkaline Phosphatase: 85 U/L (ref 38–126)
BUN: 23 mg/dL — ABNORMAL HIGH (ref 6–20)
CHLORIDE: 127 mmol/L — AB (ref 101–111)
CO2: 22 mmol/L (ref 22–32)
CREATININE: 1.18 mg/dL (ref 0.61–1.24)
Calcium: 7.9 mg/dL — ABNORMAL LOW (ref 8.9–10.3)
Glucose, Bld: 182 mg/dL — ABNORMAL HIGH (ref 65–99)
POTASSIUM: 4.4 mmol/L (ref 3.5–5.1)
SODIUM: 160 mmol/L — AB (ref 135–145)
Total Bilirubin: 10.9 mg/dL — ABNORMAL HIGH (ref 0.3–1.2)
Total Protein: 6.1 g/dL — ABNORMAL LOW (ref 6.5–8.1)

## 2015-07-06 LAB — RETICULOCYTES
RBC.: 2.66 MIL/uL — AB (ref 4.22–5.81)
RETIC CT PCT: 7.3 % — AB (ref 0.4–3.1)
Retic Count, Absolute: 194.2 10*3/uL — ABNORMAL HIGH (ref 19.0–186.0)

## 2015-07-06 LAB — URINE MICROSCOPIC-ADD ON: RBC / HPF: NONE SEEN RBC/hpf (ref 0–5)

## 2015-07-06 LAB — FOLATE: FOLATE: 20 ng/mL (ref >5.9)

## 2015-07-06 LAB — GLUCOSE, CAPILLARY
GLUCOSE-CAPILLARY: 148 mg/dL — AB (ref 65–99)
GLUCOSE-CAPILLARY: 159 mg/dL — AB (ref 65–99)
GLUCOSE-CAPILLARY: 133 mg/dL — AB (ref 65–99)
Glucose-Capillary: 146 mg/dL — ABNORMAL HIGH (ref 65–99)

## 2015-07-06 LAB — IRON AND TIBC
Iron: 114 ug/dL (ref 45–182)
SATURATION RATIOS: 88 % — AB (ref 17.9–39.5)
TIBC: 130 ug/dL — ABNORMAL LOW (ref 250–450)
UIBC: 16 ug/dL

## 2015-07-06 LAB — URINALYSIS, ROUTINE W REFLEX MICROSCOPIC
GLUCOSE, UA: NEGATIVE mg/dL
KETONES UR: 15 mg/dL — AB
Leukocytes, UA: NEGATIVE
Nitrite: POSITIVE — AB
PROTEIN: NEGATIVE mg/dL
Specific Gravity, Urine: 1.025 (ref 1.005–1.030)
pH: 5.5 (ref 5.0–8.0)

## 2015-07-06 LAB — FERRITIN: Ferritin: 524 ng/mL — ABNORMAL HIGH (ref 24–336)

## 2015-07-06 LAB — MAGNESIUM: MAGNESIUM: 1.9 mg/dL (ref 1.7–2.4)

## 2015-07-06 LAB — VITAMIN B12: VITAMIN B 12: 1464 pg/mL — AB (ref 180–914)

## 2015-07-06 LAB — PHOSPHORUS: PHOSPHORUS: 1.9 mg/dL — AB (ref 2.5–4.6)

## 2015-07-06 MED ORDER — METOPROLOL TARTRATE 1 MG/ML IV SOLN
5.0000 mg | Freq: Four times a day (QID) | INTRAVENOUS | Status: DC
  Administered 2015-07-06 – 2015-07-08 (×8): 5 mg via INTRAVENOUS
  Filled 2015-07-06 (×11): qty 5

## 2015-07-06 NOTE — Progress Notes (Signed)
[place] TEAM 1 - Stepdown/ICU TEAM PROGRESS NOTE  Derrick [name] ZOX:[id number] DOB: Oct 05, 1970 DOA: 08-Jul-2015 PCP: No primary care provider on file.  Admit HPI / Brief Narrative: 45 year old male with Hx ETOH abuse, smoker (1ppd), hypertension, and admission for GI bleed 2/21 > 2/24 with coffee ground emesis x 2-3 days and melena x 1 week. 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 He returned 3/1 after being found by family lethargic and weak with coffee-ground emesis. No red/bloody vomit or stool per family. Still actively drinking. Last drink was 2/28.   Significant Events: 3/1 re-admit w/ coffee ground emesis 3/1 R IJ cortis  3/2 EGD - ?variceal bleeding, 4 bands placed   HPI/Subjective: The pt remains very agitated when attempts are made to wean him from CIWA.  He is currently sedate and unable to provide a hx.  He does not appear uncomfortable.  He is in no resp distress.    Assessment/Plan:  Upper GI bleed due to esophageal varices  S/p variceal banding x4 3/2 - bleeding recurred 3/4 necessitating return to octreotide drip - now off octreotide with no evidence of ongoing bleeding - continue PPI  Acute blood loss anemia  Hgb appears to be holding steady at ~8.5 - nadir was 7.1- transfuse prn to keep Hgb 7.0 or > - s/p 3U PRBC thus far   Hypernatremia  Increase free water and follow - likely contributing to AMS  ETOH cirrhosis - Ascites - Hepatic encephalopathy Ammonia normalized as of 3/5   EtOH withdrawal - delirium  CIWA protocol to continue in SDU setting - pt is now in day 7 of withdrawal but remains very agitated when attempts are made to wean his ativan - will check CT head to r/o acute pathology - search for other contributing factors   Coagulopathy - r/t cirrhosis  w/ scheduled vitamin K and 4U FFP his INR  has improved to ~2 - following trend    At risk SBP with GI bleed, cirrhosis  Empiric ceftriaxone dosed for 5 days - watch for evidence of recurring infection  Hyperglycemia Felt to be due to octreotide - A1c was 6.2 06/24/15 so appears to have predisposition for DM - reasonably controlled at this time   Hypotension - resolved  AKI resolved with fluids   Hypokalemia likely signif total body deficit due to EtOH abuse - normalized and stable presently   Hypophosphatemia  Mild to mod - cont to replace as needed - follow   Nutrition  W/ recent bleeding varices I do not feel it is safe to place and NG - if pt does not improve soon will have to consider initiating TNA  Code Status: FULL Family Communication: no family present at time of exam Disposition Plan: SDU  Consultants: [facility]  Antibiotics: ceftriaxone 3/1 > 3/5  DVT prophylaxis: SCDs  Objective: Blood pressure 117/81, pulse 125, temperature 98.8 F (37.1 C), temperature source Axillary, resp. rate 30, height  (1.702 m), weight 72.5 kg (159 lb 13.3 oz), SpO2 99 %.  Intake/Output Summary (Last 24 hours) at 07/06/15 1055 Last data filed at 07/06/15 1000  Gross per 24 hour  Intake 1701.25 ml  Output     60 ml  Net 1641.25 ml   Exam: General: obtunded / sedate  Lungs: Clear to auscultation bilaterally - no wheeze  Cardiovascular: Tachycardic - regular - without murmur  Abdomen: moderate ascites, bowel sounds positive, no rebound, soft Extremities: no significant cyanosis, clubbing, or edema bilateral lower extremities  Data Reviewed: Basic Metabolic Panel:  Recent Labs Lab 07/01/15 0409  07/01/15 1300  07/02/15 1517 07/02/15 1634 07/03/15 0545 07/05/15 0522 07/06/15 0445  NA 134*  --  136  < > 144 146* 147* 158* 160*  K 2.4*  --  2.6*  < > 7.0* 3.8 3.9 4.7 4.4  CL 94*  --  98*  < > 113* 111 111 127* 127*  CO2 27  --  27  < > 26 28 24 23 22   GLUCOSE 343*  --  259*  < > 164* 166* 176* 162*  182*  BUN 70*  --  60*  < > 27* 27* 21* 18 23*  CREATININE 1.75*  --  1.42*  < > 0.93 0.96 1.01 0.93 1.18  CALCIUM 7.0*  --  7.3*  < > 6.9* 7.3* 7.6* 7.8* 7.9*  MG 1.4*  --  2.5*  --   --   --  1.7  --  1.9  PHOS  --   < > 1.3*  --  <1.0* 1.3* 1.7* 2.3* 1.9*  < > = values in this interval not displayed.  CBC:  Recent Labs Lab 07/02/2015 1354  07/01/15 0945 07/01/15 1300 07/02/15 0925 07/03/15 0545 07/05/15 0522  WBC 18.8*  < > 12.4* 18.0* 12.6* 15.4* 13.2*  NEUTROABS 13.1*  --  9.5* 13.4*  --   --   --   HGB 4.8*  < > 7.1* 7.8* 7.8* 8.2* 8.6*  HCT 14.3*  < > 20.6* 22.0* 23.0* 24.7* 27.8*  MCV 105.9*  < > 93.6 93.6 96.2 99.6 104.5*  PLT 168  < > 58* 75* 70* 78* 94*  < > = values in this interval not displayed.  Liver Function Tests:  Recent Labs Lab 07/03/2015 1354 07/03/15 0545 07/04/15 0543 07/05/15 0522 07/06/15 0445  AST <5* 254* 177* 121* 98*  ALT <5* 173* 150* 127* 111*  ALKPHOS 52 68 70 76 85  BILITOT 4.6* 9.9* 10.9* 10.8* 10.9*  PROT 4.2* 5.7* 5.4* 6.0* 6.1*  ALBUMIN 1.7* 2.4* 2.3* 2.3* 2.2*    Recent Labs Lab 03-08-2016 1209 07/03/15 0545 07/05/15 0525  AMMONIA 75* 25 21    Coags:  Recent Labs Lab 03-08-2016 1117 07/02/15 0925 07/03/15 0545 07/05/15 1834 07/06/15 0445  INR 2.38* 2.08* 1.95* 2.00* 1.94*    Recent Labs Lab 03-08-2016 0345 07/02/15 0925  APTT 36 35   CBG:  Recent Labs Lab 07/05/15 1147 07/05/15 1547 07/05/15 2015 07/05/15 2347 07/06/15 0410  GLUCAP 117* 130* 147* 146* 148*    Recent Results (from the past 240 hour(s))  Urine culture     Status: None   Collection Time: 03-08-2016  2:15 AM  Result Value Ref Range Status   Specimen Description URINE, CLEAN CATCH  Final   Special Requests NONE  Final   Culture NO GROWTH 1 DAY  Final   Report Status 07/01/2015 FINAL  Final     Studies:   Recent x-ray studies have been reviewed in detail by the Attending Physician  Scheduled Meds:  Scheduled Meds: . antiseptic oral  rinse  7 mL Mouth Rinse BID  . folic acid  1 mg Intravenous Daily  . insulin aspart  0-20 Units Subcutaneous 6 times per day  . lactulose  30 g Oral BID  . pantoprazole (PROTONIX) IV  40 mg Intravenous Q12H  . thiamine  100 mg Intravenous Daily    Time spent on care of this patient: 35 mins   MCCLUNG,JEFFREY T , MD   [facility] Office  [phone] Pager - Text Page per Loretha Stapler as per below:  On-Call/Text Page:      [email or web address]      password [email or web address]  If 7PM-7AM, please contact night-coverage [email or web address] Password [email or web address] 07/06/2015, 10:55 AM   LOS: 7 days

## 2015-07-07 DIAGNOSIS — I739 Peripheral vascular disease, unspecified: Secondary | ICD-10-CM

## 2015-07-07 DIAGNOSIS — K7682 Hepatic encephalopathy: Secondary | ICD-10-CM | POA: Diagnosis present

## 2015-07-07 DIAGNOSIS — K7290 Hepatic failure, unspecified without coma: Secondary | ICD-10-CM | POA: Diagnosis present

## 2015-07-07 DIAGNOSIS — E870 Hyperosmolality and hypernatremia: Secondary | ICD-10-CM

## 2015-07-07 DIAGNOSIS — K7030 Alcoholic cirrhosis of liver without ascites: Secondary | ICD-10-CM

## 2015-07-07 DIAGNOSIS — I8501 Esophageal varices with bleeding: Principal | ICD-10-CM

## 2015-07-07 DIAGNOSIS — E876 Hypokalemia: Secondary | ICD-10-CM | POA: Diagnosis present

## 2015-07-07 LAB — COMPREHENSIVE METABOLIC PANEL
ALT: 89 U/L — ABNORMAL HIGH (ref 17–63)
AST: 82 U/L — AB (ref 15–41)
Albumin: 2 g/dL — ABNORMAL LOW (ref 3.5–5.0)
Alkaline Phosphatase: 89 U/L (ref 38–126)
Anion gap: 9 (ref 5–15)
BILIRUBIN TOTAL: 10.7 mg/dL — AB (ref 0.3–1.2)
BUN: 23 mg/dL — AB (ref 6–20)
CO2: 21 mmol/L — ABNORMAL LOW (ref 22–32)
CREATININE: 1.5 mg/dL — AB (ref 0.61–1.24)
Calcium: 7.7 mg/dL — ABNORMAL LOW (ref 8.9–10.3)
Chloride: 123 mmol/L — ABNORMAL HIGH (ref 101–111)
GFR, EST NON AFRICAN AMERICAN: 56 mL/min — AB (ref >60)
Glucose, Bld: 173 mg/dL — ABNORMAL HIGH (ref 65–99)
POTASSIUM: 3.9 mmol/L (ref 3.5–5.1)
Sodium: 153 mmol/L — ABNORMAL HIGH (ref 135–145)
TOTAL PROTEIN: 5.9 g/dL — AB (ref 6.5–8.1)

## 2015-07-07 LAB — URINE CULTURE: CULTURE: NO GROWTH

## 2015-07-07 LAB — GLUCOSE, CAPILLARY
GLUCOSE-CAPILLARY: 130 mg/dL — AB (ref 65–99)
GLUCOSE-CAPILLARY: 132 mg/dL — AB (ref 65–99)
GLUCOSE-CAPILLARY: 135 mg/dL — AB (ref 65–99)
GLUCOSE-CAPILLARY: 154 mg/dL — AB (ref 65–99)
Glucose-Capillary: 145 mg/dL — ABNORMAL HIGH (ref 65–99)
Glucose-Capillary: 152 mg/dL — ABNORMAL HIGH (ref 65–99)

## 2015-07-07 LAB — PROTIME-INR
INR: 1.91 — AB (ref 0.00–1.49)
Prothrombin Time: 21.8 seconds — ABNORMAL HIGH (ref 11.6–15.2)

## 2015-07-07 LAB — AMMONIA: Ammonia: 32 umol/L (ref 9–35)

## 2015-07-07 LAB — MAGNESIUM: MAGNESIUM: 1.7 mg/dL (ref 1.7–2.4)

## 2015-07-07 LAB — PHOSPHORUS: PHOSPHORUS: 1.9 mg/dL — AB (ref 2.5–4.6)

## 2015-07-07 MED ORDER — CETYLPYRIDINIUM CHLORIDE 0.05 % MT LIQD
7.0000 mL | Freq: Two times a day (BID) | OROMUCOSAL | Status: DC
  Administered 2015-07-08 (×2): 7 mL via OROMUCOSAL

## 2015-07-07 MED ORDER — POTASSIUM PHOSPHATES 15 MMOLE/5ML IV SOLN
40.0000 meq | Freq: Once | INTRAVENOUS | Status: AC
  Administered 2015-07-07: 40 meq via INTRAVENOUS
  Filled 2015-07-07: qty 9.09

## 2015-07-07 MED ORDER — CHLORHEXIDINE GLUCONATE 0.12 % MT SOLN
15.0000 mL | Freq: Two times a day (BID) | OROMUCOSAL | Status: DC
  Administered 2015-07-07 – 2015-07-08 (×2): 15 mL via OROMUCOSAL

## 2015-07-07 NOTE — Progress Notes (Signed)
[place] TEAM 1 - Stepdown/ICU TEAM Progress Note  Derrick JewRobert W [name] ZOX:[id number] DOB: 1970-10-26 DOA: 07/25/2015 PCP: No primary care provider on file.  Admit HPI / Brief Narrative: 45 year old WM PMHx ETOH abuse, Tobacco Abuse smoker (1ppd), COPD, HTN, GI bleed,   Admission for GI bleed 2/21 > 2/24 with coffee ground emesis x 2-3 days and melena x 1 week. 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 He returned 3/1 after being found by family lethargic and weak with coffee-ground emesis. No red/bloody vomit or stool per family. Still actively drinking. Last drink was 2/28.    HPI/Subjective: 3/9  A/O  0 patient's eyes are open, only moans. Follows no commands. This is a distinct downward change from last week.       Assessment/Plan:  Upper GI bleed due to esophageal varices  -S/p variceal banding x4  3/2 - bleeding recurred 3/4 necessitating return to octreotide drip, now back off octreotide with no evidence of ongoing bleeding  - continue Protonix 40 mg BID  Acute blood loss anemia  -Hgb appears to be holding steady - nadir was 7.1 - s/p s/p transfused 3U PRBC  -Transfuse for hemoglobin<7   Acute Encephalopathy -Most likely multifactorial to include alcohol withdrawal, hepatic encephalopathy, and metabolic encephalopathy -Ammonia normalized -Patient still has multiple metabolic abnormalities. Hypernatremia?  - pt is now in day 8 of withdrawal but remains very agitated when attempts are made to wean his ativan. Will begin weaning Ativan in A.m.  -A.m. EKG, check for QTC prolongation; if no prolongation will start using antipsychotic for agitation instead of Ativan -Head CT nondiagnostic for cause. Consider MRI  ETOH cirrhosis - Ascites - Hepatic encephalopathy -Ammonia normalized as of 3/5   EtOH withdrawal  -CIWA  protocol to continue in SDU setting   Coagulopathy - r/t cirrhosis  -w/ scheduled vitamin K and 4U FFP his INR has improved to ~2 - follow   At risk SBP with GI bleed, cirrhosis  -Empiric ceftriaxone dosed for 5 days  Hyperglycemia -Felt to be due to octreotide and D5W - A1c was 6.2 06/24/15 so appears to have predisposition for DM  - Continue resistant SSI; reasonably controlled at this time   Hypotension  - resolved  AKI -Creatinine trending up    PVD -Patient's lower extremities have become cold, mottled -Bilateral LE Doppler R/O DVT  Hypernatremia -Continue D5W 15425ml/hr -Start resistant SSI, secondary to starting D5W for hypernatremia   Hypocalcemia -Corrected calcium= 9.5  Hypokalemia -Resolved   Hypophosphatemia  -K-Phos 40 mEq    UTI? -Obtain urinalysis and urine culture     Code Status: FULL Family Communication: no family present at time of exam Disposition Plan: Resolution encephalopathy    Consultants: [facility]   Procedure/Significant Events: 3/1 re-admit w/ coffee ground emesis 3/1 R IJ cortis  3/2 EGD - ?variceal bleeding, 4 bands placed  s/p transfused 3U PRBC   3/8 CT head WO contrast;Age advanced atrophy and white matter disease   Culture 3/2 urine negative final 3/8 urine; negative final    Antibiotics: ceftriaxone 3/1 > 3/5   DVT prophylaxis: SCDs   Devices    LINES / TUBES:      Continuous Infusions: . dextrose 125 mL/hr at 07/07/15 1037    Objective: VITAL SIGNS: Temp: 99.8 F (37.7 C) (03/09 2016) Temp Source:  Oral (03/09 2016) BP: 120/78 mmHg (03/09 2000) Pulse Rate: 107 (03/09 2000) SPO2; FIO2:   Intake/Output Summary (Last 24 hours) at 07/07/15 2102 Last data filed at 07/07/15 1900  Gross per 24 hour  Intake   2750 ml  Output    200 ml  Net   2550 ml     Exam: General:A/O  0 patient's eyes are open, only moans. Follows no commands., No acute respiratory distress Eyes: Negative  headache, negative scleral hemorrhage, positive icterus ENT: Negative Runny nose, negative gingival bleeding, Neck:  Negative scars, masses, torticollis, lymphadenopathy, JVD Lungs: Clear to auscultation bilaterally without wheezes or crackles Cardiovascular: Regular rate and rhythm without murmur gallop or rub normal S1 and S2 Abdomen:negative abdominal pain, distended, positive soft, bowel sounds, no rebound, no ascites, no appreciable mass GU: Patient urine still remains extremely dirty. Skin; patient's lower extremity becoming mottled Extremities: No significant cyanosis, clubbing, or edema bilateral lower extremities Psychiatric:  Negative depression, negative anxiety, negative fatigue, negative mania  Neurologic:  Cranial nerves II through XII intact, tongue/uvula midline, all extremities muscle strength 5/5, sensation intact throughout, negative dysarthria, negative expressive aphasia, negative receptive aphasia.   Data Reviewed: Basic Metabolic Panel:  Recent Labs Lab 07/01/15 0409 07/01/15 1300  07/02/15 1634 07/03/15 0545 07/05/15 0522 07/06/15 0445 07/07/15 0455  NA 134* 136  < > 146* 147* 158* 160* 153*  K 2.4* 2.6*  < > 3.8 3.9 4.7 4.4 3.9  CL 94* 98*  < > 111 111 127* 127* 123*  CO2 27 27  < > 21*  GLUCOSE 343* 259*  < > 166* 176* 162* 182* 173*  BUN 70* 60*  < > 27* 21* 18 23* 23*  CREATININE 1.75* 1.42*  < > 0.96 1.01 0.93 1.18 1.50*  CALCIUM 7.0* 7.3*  < > 7.3* 7.6* 7.8* 7.9* 7.7*  MG 1.4* 2.5*  --   --  1.7  --  1.9 1.7  PHOS  --  1.3*  < > 1.3* 1.7* 2.3* 1.9* 1.9*  < > = values in this interval not displayed. Liver Function Tests:  Recent Labs Lab 07/03/15 0545 07/04/15 0543 07/05/15 0522 07/06/15 0445 07/07/15 0455  AST 254* 177* 121* 98* 82*  ALT 173* 150* 127* 111* 89*  ALKPHOS 68 70 76 85 89  BILITOT 9.9* 10.9* 10.8* 10.9* 10.7*  PROT 5.7* 5.4* 6.0* 6.1* 5.9*  ALBUMIN 2.4* 2.3* 2.3* 2.2* 2.0*   No results for input(s): LIPASE,  AMYLASE in the last 168 hours.  Recent Labs Lab 07/03/15 0545 07/05/15 0525 07/07/15 0455  AMMONIA 25 21 32   CBC:  Recent Labs Lab 07/01/15 0945 07/01/15 1300 07/02/15 0925 07/03/15 0545 07/05/15 0522  WBC 12.4* 18.0* 12.6* 15.4* 13.2*  NEUTROABS 9.5* 13.4*  --   --   --   HGB 7.1* 7.8* 7.8* 8.2* 8.6*  HCT 20.6* 22.0* 23.0* 24.7* 27.8*  MCV 93.6 93.6 96.2 99.6 104.5*  PLT 58* 75* 70* 78* 94*   Cardiac Enzymes: No results for input(s): CKTOTAL, CKMB, CKMBINDEX, TROPONINI in the last 168 hours. BNP (last 3 results) No results for input(s): BNP in the last 8760 hours.  ProBNP (last 3 results) No results for input(s): PROBNP in the last 8760 hours.  CBG:  Recent Labs Lab 07/07/15 0424 07/07/15 0808 07/07/15 1130 07/07/15 1538 07/07/15 2011  GLUCAP 130* 145* 132* 154* 152*    Recent Results (from the past 240 hour(s))  Urine culture     Status:  None   Collection Time: 07/05/2015  2:15 AM  Result Value Ref Range Status   Specimen Description URINE, CLEAN CATCH  Final   Special Requests NONE  Final   Culture NO GROWTH 1 DAY  Final   Report Status 07/01/2015 FINAL  Final  Culture, Urine     Status: None   Collection Time: 07/06/15  8:41 AM  Result Value Ref Range Status   Specimen Description URINE, CLEAN CATCH  Final   Special Requests NONE  Final   Culture NO GROWTH 1 DAY  Final   Report Status 07/07/2015 FINAL  Final     Studies:  Recent x-ray studies have been reviewed in detail by the Attending Physician  Scheduled Meds:  Scheduled Meds: . [START ON 07/28/2015] antiseptic oral rinse  7 mL Mouth Rinse q12n4p  . chlorhexidine  15 mL Mouth Rinse BID  . folic acid  1 mg Intravenous Daily  . insulin aspart  0-20 Units Subcutaneous 6 times per day  . lactulose  30 g Oral BID  . metoprolol  5 mg Intravenous 4 times per day  . pantoprazole (PROTONIX) IV  40 mg Intravenous Q12H  . potassium phosphate IVPB (mEq)  40 mEq Intravenous Once  . thiamine  100  mg Intravenous Daily    Time spent on care of this patient: 40 mins   Derrick [name], Roselind Messier , MD  [facility] Office  [phone] Pager [phone]  On-Call/Text Page:      [email or web address]      password [email or web address]  If 7PM-7AM, please contact night-coverage [email or web address] Password [email or web address] 07/07/2015, 9:02 PM   LOS: 8 days   Care during the described time interval was provided by me .  I have reviewed this patient's available data, including medical history, events of note, physical examination, and all test results as part of my evaluation. I have personally reviewed and interpreted all radiology studies.   Carolyne Littles, MD [phone] Pager

## 2015-07-07 NOTE — Progress Notes (Signed)
Initial Nutrition Assessment  DOCUMENTATION CODES:   Severe malnutrition in context of chronic illness  INTERVENTION:    Consider TPN if unable to advance diet within the next 24-48 hours.  NUTRITION DIAGNOSIS:   Malnutrition related to chronic illness as evidenced by energy intake < or equal to 75% for > or equal to 1 month, severe depletion of muscle mass, severe fluid accumulation.  GOAL:   Patient will meet greater than or equal to 90% of their needs  MONITOR:   Diet advancement, PO intake, Labs, Weight trends, I & O's  REASON FOR ASSESSMENT:   NPO/Clear Liquid Diet, Low Braden    ASSESSMENT:   45 year old male with Hx ETOH abuse, smoker (1ppd), hypertension, and admission for GI bleed 2/21 > 2/24 with coffee ground emesis x 2-3 days and melena x 1 week. 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 He returned 3/1 after being found by family lethargic and weak with coffee-ground emesis.Continues to actively drink, last drink 2/28.  S/p variceal banding x4 3/2 - bleeding recurred 3/4 necessitating return to octreotide drip - now off octreotide with no evidence of ongoing bleeding. Patient not alert during RD visit. Nutrition-Focused physical exam completed. Findings are no fat depletion, severe muscle depletion, and + ascites. Labs reviewed: sodium, BUN, creatinine elevated, likely related to free water deficit. May need TPN if unable to advance PO diet in the next few days. Unsafe to place NGT due to recent bleeding varices. Given recent altered GI function (since previous admission) and hx of ETOH abuse, suspect intake for the past month has been suboptimal.   Diet Order:  Diet NPO time specified Except for: Sips with Meds  Skin:  Reviewed, no issues  Last BM:  3/8  Height:   Ht Readings from Last 1 Encounters:   07/02/2015 5\' 7"  (1.702 m)    Weight:   Wt Readings from Last 1 Encounters:  07/07/15 163 lb 12.8 oz (74.3 kg)    Ideal Body Weight:  67.3 kg  BMI:  Body mass index is 25.65 kg/(m^2).  Estimated Nutritional Needs:   Kcal:  2000-2200  Protein:  100-120 gm  Fluid:  ~2 L  EDUCATION NEEDS:   No education needs identified at this time  Joaquin CourtsKimberly Harris, RD, LDN, CNSC Pager [phone] After Hours Pager [phone]

## 2015-07-08 ENCOUNTER — Inpatient Hospital Stay (HOSPITAL_COMMUNITY): Payer: Medicaid Other

## 2015-07-08 DIAGNOSIS — I82403 Acute embolism and thrombosis of unspecified deep veins of lower extremity, bilateral: Secondary | ICD-10-CM

## 2015-07-08 DIAGNOSIS — R34 Anuria and oliguria: Secondary | ICD-10-CM

## 2015-07-08 DIAGNOSIS — K7031 Alcoholic cirrhosis of liver with ascites: Secondary | ICD-10-CM | POA: Diagnosis present

## 2015-07-08 DIAGNOSIS — Z515 Encounter for palliative care: Secondary | ICD-10-CM | POA: Insufficient documentation

## 2015-07-08 DIAGNOSIS — G9340 Encephalopathy, unspecified: Secondary | ICD-10-CM

## 2015-07-08 LAB — URINALYSIS W MICROSCOPIC (NOT AT ARMC)
Glucose, UA: NEGATIVE mg/dL
Hgb urine dipstick: NEGATIVE
KETONES UR: 15 mg/dL — AB
Nitrite: POSITIVE — AB
PROTEIN: NEGATIVE mg/dL
RBC / HPF: NONE SEEN RBC/hpf (ref 0–5)
SPECIFIC GRAVITY, URINE: 1.021 (ref 1.005–1.030)
pH: 5 (ref 5.0–8.0)

## 2015-07-08 LAB — CBC WITH DIFFERENTIAL/PLATELET
BASOS PCT: 0 %
Basophils Absolute: 0 10*3/uL (ref 0.0–0.1)
EOS PCT: 2 %
Eosinophils Absolute: 0.3 10*3/uL (ref 0.0–0.7)
HEMATOCRIT: 27.8 % — AB (ref 39.0–52.0)
Hemoglobin: 9.1 g/dL — ABNORMAL LOW (ref 13.0–17.0)
LYMPHS PCT: 15 %
Lymphs Abs: 2.4 10*3/uL (ref 0.7–4.0)
MCH: 34.1 pg — ABNORMAL HIGH (ref 26.0–34.0)
MCHC: 32.7 g/dL (ref 30.0–36.0)
MCV: 104.1 fL — AB (ref 78.0–100.0)
MONO ABS: 1.3 10*3/uL — AB (ref 0.1–1.0)
Monocytes Relative: 8 %
NEUTROS PCT: 75 %
Neutro Abs: 11.7 10*3/uL — ABNORMAL HIGH (ref 1.7–7.7)
Platelets: 137 10*3/uL — ABNORMAL LOW (ref 150–400)
RBC: 2.67 MIL/uL — AB (ref 4.22–5.81)
RDW: 21.2 % — AB (ref 11.5–15.5)
WBC: 15.7 10*3/uL — AB (ref 4.0–10.5)

## 2015-07-08 LAB — PHOSPHORUS: Phosphorus: 3.5 mg/dL (ref 2.5–4.6)

## 2015-07-08 LAB — POCT I-STAT 3, ART BLOOD GAS (G3+)
ACID-BASE DEFICIT: 5 mmol/L — AB (ref 0.0–2.0)
BICARBONATE: 17.8 meq/L — AB (ref 20.0–24.0)
O2 Saturation: 92 %
PO2 ART: 61 mmHg — AB (ref 80.0–100.0)
Patient temperature: 98.6
TCO2: 19 mmol/L (ref 0–100)
pCO2 arterial: 26.7 mmHg — ABNORMAL LOW (ref 35.0–45.0)
pH, Arterial: 7.432 (ref 7.350–7.450)

## 2015-07-08 LAB — COMPREHENSIVE METABOLIC PANEL
ALBUMIN: 2 g/dL — AB (ref 3.5–5.0)
ALT: 76 U/L — ABNORMAL HIGH (ref 17–63)
AST: 78 U/L — AB (ref 15–41)
Alkaline Phosphatase: 85 U/L (ref 38–126)
Anion gap: 8 (ref 5–15)
BUN: 31 mg/dL — AB (ref 6–20)
CHLORIDE: 116 mmol/L — AB (ref 101–111)
CO2: 20 mmol/L — ABNORMAL LOW (ref 22–32)
Calcium: 7.7 mg/dL — ABNORMAL LOW (ref 8.9–10.3)
Creatinine, Ser: 2.02 mg/dL — ABNORMAL HIGH (ref 0.61–1.24)
GFR calc Af Amer: 45 mL/min — ABNORMAL LOW (ref >60)
GFR calc non Af Amer: 39 mL/min — ABNORMAL LOW (ref >60)
GLUCOSE: 161 mg/dL — AB (ref 65–99)
POTASSIUM: 3.9 mmol/L (ref 3.5–5.1)
Sodium: 144 mmol/L (ref 135–145)
Total Bilirubin: 12.8 mg/dL — ABNORMAL HIGH (ref 0.3–1.2)
Total Protein: 6.1 g/dL — ABNORMAL LOW (ref 6.5–8.1)

## 2015-07-08 LAB — GLUCOSE, CAPILLARY
GLUCOSE-CAPILLARY: 169 mg/dL — AB (ref 65–99)
GLUCOSE-CAPILLARY: 209 mg/dL — AB (ref 65–99)
Glucose-Capillary: 139 mg/dL — ABNORMAL HIGH (ref 65–99)
Glucose-Capillary: 171 mg/dL — ABNORMAL HIGH (ref 65–99)
Glucose-Capillary: 118 mg/dL — ABNORMAL HIGH (ref 65–99)
Glucose-Capillary: 151 mg/dL — ABNORMAL HIGH (ref 65–99)

## 2015-07-08 LAB — AMMONIA: Ammonia: 45 umol/L — ABNORMAL HIGH (ref 9–35)

## 2015-07-08 LAB — PROTIME-INR
INR: 1.95 — AB (ref 0.00–1.49)
PROTHROMBIN TIME: 22.2 s — AB (ref 11.6–15.2)

## 2015-07-08 LAB — MAGNESIUM: MAGNESIUM: 1.6 mg/dL — AB (ref 1.7–2.4)

## 2015-07-08 MED ORDER — WHITE PETROLATUM GEL
Status: AC
Start: 2015-07-08 — End: 2015-07-08
  Administered 2015-07-08: 0.2
  Filled 2015-07-08: qty 1

## 2015-07-08 MED ORDER — MAGNESIUM SULFATE 50 % IJ SOLN
3.0000 g | Freq: Once | INTRAVENOUS | Status: AC
  Administered 2015-07-08: 3 g via INTRAVENOUS
  Filled 2015-07-08: qty 6

## 2015-07-08 MED ORDER — MORPHINE SULFATE (PF) 2 MG/ML IV SOLN
2.0000 mg | INTRAVENOUS | Status: DC | PRN
  Administered 2015-07-08 (×2): 2 mg via INTRAVENOUS
  Filled 2015-07-08 (×2): qty 1

## 2015-07-08 NOTE — Progress Notes (Signed)
Family at bedside grieving.

## 2015-07-08 NOTE — Consult Note (Signed)
Consultation Note Date: 2015/08/05   Patient Name: Derrick [name]  DOB: [birth year]  MRN: [id number]  Age / Sex: 45 y.o., male  PCP: No primary care provider on file. Referring Physician: Drema Dallas, MD  Reason for Consultation: Establishing goals of care and Psychosocial/spiritual support    Clinical Assessment/Narrative: Patient is a 45 year old male hospitalized on 07/16/2015 prior to just being discharged 5 days prior, with portal gastropathy, cirrhosis of the liver related to ongoing alcohol abuse. According to chart review his mother reported seeing coffee-ground emesis. His hemoglobin on 04/23/2015 was 11. It is now 4.8. He has received 3 units of packed red cells as well as 4 units of fresh frozen plasma. He has remained encephalopathic despite treatment. He responds minimally to noxious stimuli. He is currently in ICU. He is not on pressors at this time but his blood pressure is soft. His current CODE STATUS is full code. Patient underwent esophageal banding 4 on 3/2. His ammonia level has improved, it is now 45. CT of the head shows advanced atrophy of the brain for his age. Abdomen is quite distended, ascites present. He appears jaundiced, and his feet and knees are mottled . I called patient's mother, Jujhar Everett, and spoke to her regarding my concerns for her son. Patient is unmarried with no children, and his mother is his healthcare power of attorney by proxy. I did share with her candidly that my concern if things were to continue to go the way they are now he had worsening of clinical condition overnight he could find himself on life support which would not improve his underlying liver and kidney failure at this point. She becomes quite upset, drops the phone to the floor. At this point patient's aunt picks the phone up, Darl Pikes. I discussed with her my concerns that if things were to continue to go the way they  are what I am seeing that he could potentially only have days to weeks to live. She becomes very tearful as well and hence the phone off to the patient's father wife's husband. I recommended that we not proceed with aggressive measures at this point such as intubation or defibrillation and chest compressions and make the patient a DO NOT RESUSCITATE. I apologized for having to speak this candidly over the telephone, but my concern was that patient could have an acute event overnight. Family was unable to reach a decision over the phone but agreed to consider it tonight. They were given the after hours phone number for the palliative medicine team. We also set up an appointment for 9:00 on 07/17/2015 to pursue further goals of care discussion  Contacts/Participants in Discussion: Primary Decision Maker: Nona Dell   Relationship to Patient mother HCPOA: yes  Per report patient is unmarried with no children and healthcare decision making would fall to his mother  SUMMARY OF RECOMMENDATIONS Strongly recommend that family allow [place] to make him a DO NOT RESUSCITATE/DO NOT INTUBATE today We'll meet with family on 07/14/2015 at 9 AM to discuss other goals of care Stress to the family that we could continue to offer supportive care IV fluids medicines to make him comfortable. That DO NOT RESUSCITATE did not mean do not treat Updated the family that patient is now in renal failure and that nephrology feels that they have nothing to offer patient in terms of improving his condition  Code Status/Advance Care Planning: Full code    Code Status Orders        Start  Ordered   07/06/2015 1555  Full code   Continuous     07/01/2015 1558    Code Status History    Date Active Date Inactive Code Status Order ID Comments User Context   06/21/2015  2:41 PM 06/24/2015  4:38 PM Full Code [id number]  Richarda OverlieNayana Abrol, MD ED      Other Directives:None  Symptom Management:   Pain: No current nonverbal signs and  symptoms of pain. Continue with recommendations placed by critical care medicine  Dyspnea: Respiratory rate is 16, slightly irregular. No respiratory distress, no apnea  Palliative Prophylaxis:   Aspiration, Bowel Regimen, Delirium Protocol, Frequent Pain Assessment, Oral Care and Turn Reposition  Additional Recommendations (Limitations, Scope, Preferences):  Full Scope Treatment but did strongly recommend that we make patient DO NOT RESUSCITATE/DO NOT INTUBATE tonight and make tomorrow morning to further discuss goals of care related to liver failure now in the setting of renal failure    Psycho-social/Spiritual:  Support System: Poor Desire for further Chaplaincy support:no Additional Recommendations: Grief/Bereavement Support  Prognosis: < 2 weeks  Discharge Planning: Anticipate hospital death; however if he does stabilize he he likely would meet inpatient hospice criteria once we ascertain what patient's goals of care are   Chief Complaint/ Primary Diagnoses: Present on Admission:  . GIB (gastrointestinal bleeding) . Alcohol withdrawal (HCC) . Gastrointestinal hemorrhage associated with other gastritis . COPD (chronic obstructive pulmonary disease) (HCC) . Tobacco abuse . Alcohol abuse . Alcoholic gastritis with hemorrhage . Acute encephalopathy . Hyperglycemia . Acute kidney injury (HCC) . Bleeding esophageal varices (HCC) . Encephalopathy, hepatic (HCC) . Alcoholic cirrhosis of liver without ascites (HCC) . Hypophosphatemia . Hypokalemia . Hypocalcemia . Hypernatremia . PVD (peripheral vascular disease) (HCC) . Alcoholic cirrhosis of liver with ascites (HCC) . Oliguria  I have reviewed the medical record, interviewed the patient and family, and examined the patient. The following aspects are pertinent.  Past Medical History  Diagnosis Date  . Hypertension   . ETOH abuse   . Cirrhosis (HCC)   . GI bleed    Social History   Social History  . Marital  Status: Single    Spouse Name: N/A  . Number of Children: N/A  . Years of Education: N/A   Social History Main Topics  . Smoking status: Current Every Day Smoker    Types: Cigarettes  . Smokeless tobacco: Never Used  . Alcohol Use: Yes  . Drug Use: Yes    Special: Marijuana  . Sexual Activity: No   Other Topics Concern  . None   Social History Narrative   Family History  Problem Relation Age of Onset  . Hypertension Father    Scheduled Meds: . antiseptic oral rinse  7 mL Mouth Rinse q12n4p  . chlorhexidine  15 mL Mouth Rinse BID  . folic acid  1 mg Intravenous Daily  . insulin aspart  0-20 Units Subcutaneous 6 times per day  . lactulose  30 g Oral BID  . metoprolol  5 mg Intravenous 4 times per day  . pantoprazole (PROTONIX) IV  40 mg Intravenous Q12H  . thiamine  100 mg Intravenous Daily   Continuous Infusions: . dextrose 125 mL/hr at 07/27/2015 1300   PRN Meds:.ondansetron (ZOFRAN) IV Medications Prior to Admission:  Prior to Admission medications   Medication Sig Start Date End Date Taking? Authorizing Provider  ciprofloxacin (CIPRO) 500 MG tablet Take 1 tablet (500 mg total) by mouth 2 (two) times daily. 06/24/15  Yes Joseph ArtJessica U Vann, DO  furosemide (LASIX) 40 MG tablet Take 1 tablet (40 mg total) by mouth daily. 06/24/15  Yes Jessica U Vann, DO  pantoprazole (PROTONIX) 40 MG tablet Take 1 tablet (40 mg total) by mouth daily. 06/24/15  Yes Joseph Art, DO  propranolol (INDERAL) 10 MG tablet Take 1 tablet (10 mg total) by mouth 2 (two) times daily. 06/24/15  Yes Joseph Art, DO  spironolactone (ALDACTONE) 100 MG tablet Take 1 tablet (100 mg total) by mouth daily. 06/24/15  Yes Joseph Art, DO   No Known Allergies  Review of Systems  Unable to perform ROS   Physical Exam  Constitutional:  Cachetic, jaundiced, middle aged man; acutely ill  HENT:  Head: Normocephalic and atraumatic.  Cardiovascular:  Tachy BP soft  Respiratory:  Shallow, irrg. No apnea    GI:  ascites  Neurological:  Unresponsive even to noxious stimuli  Skin:  Cool; feet and knees mottled    Vital Signs: BP 88/63 mmHg  Pulse 113  Temp(Src) 98.4 F (36.9 C) (Oral)  Resp 30  Ht  (1.702 m)  Wt 77.1 kg (169 lb 15.6 oz)  BMI 26.62 kg/m2  SpO2 99%  SpO2: SpO2: 99 % O2 Device:SpO2: 99 % O2 Flow Rate: .O2 Flow Rate (L/min): 4 L/min  IO: Intake/output summary:  Intake/Output Summary (Last 24 hours) at 07/13/2015 1428 Last data filed at 07/11/2015 1300  Gross per 24 hour  Intake   3390 ml  Output    330 ml  Net   3060 ml    LBM: Last BM Date: 07/06/15 Baseline Weight: Weight: 75.297 kg (166 lb) Most recent weight: Weight: 77.1 kg (169 lb 15.6 oz)      Palliative Assessment/Data:  Flowsheet Rows        Most Recent Value   Intake Tab    Referral Department  Hospitalist   Unit at Time of Referral  ICU   Palliative Care Primary Diagnosis  Other (Comment) [GI]   Date Notified  07/20/2015   Palliative Care Type  New Palliative care   Reason for referral  Clarify Goals of Care   Date of Admission  2015/07/25   # of days IP prior to Palliative referral  9   Clinical Assessment    Psychosocial & Spiritual Assessment    Palliative Care Outcomes       Additional Data Reviewed:  CBC:    Component Value Date/Time   WBC 15.7* 07/22/2015 1045   HGB 9.1* 07/10/2015 1045   HCT 27.8* 07/15/2015 1045   PLT 137* 07/16/2015 1045   MCV 104.1* 07/07/2015 1045   NEUTROABS 11.7* 07/01/2015 1045   LYMPHSABS 2.4 07/19/2015 1045   MONOABS 1.3* 07/10/2015 1045   EOSABS 0.3 07/15/2015 1045   BASOSABS 0.0 07/22/2015 1045   Comprehensive Metabolic Panel:    Component Value Date/Time   NA 144 07/18/2015 0557   K 3.9 07/03/2015 0557   CL 116* 07/11/2015 0557   CO2 20* 07/02/2015 0557   BUN 31* 07/05/2015 0557   CREATININE 2.02* 07/25/2015 0557   GLUCOSE 161* 07/15/2015 0557   CALCIUM 7.7* 07/11/2015 0557   AST 78* 07/06/2015 0557   ALT 76* 07/16/2015 0557    ALKPHOS 85 07/06/2015 0557   BILITOT 12.8* 07/07/2015 0557   PROT 6.1* 07/28/2015 0557   ALBUMIN 2.0* 07/02/2015 0557     Time In: 1330 Time Out: 1440 Time Total: 70 min Greater than 50%  of this time was spent counseling and coordinating care related  to the above assessment and plan.  Signed by: Irean Hong, NP  Irean Hong, NP  07/15/2015, 2:28 PM  Please contact Palliative Medicine Team phone at [phone] for questions and concerns.

## 2015-07-08 NOTE — Progress Notes (Signed)
PULMONARY / CRITICAL CARE MEDICINE   Name: Derrick [name] MRN: [id number] DOB: [birth year]    ADMISSION DATE:  07/28/2015   CONSULTATION DATE: 3/1  REFERRING MD: EDP  CHIEF COMPLAINT: AMS  BRIEF: 45 y/o male with alcoholic cirrhosis admitted on 3/1 with an upper GI bleed. Transferred to IM service 3/5 after being scoped on 3/2 and found varices that were not bleeding, but placed 4 bands. Course was complicated by persistent encephalopathy, progressive renal failure, Hypernatremia. On 3/10 PCCM was re-consulted as pt developed progressive decreased LOC, hypotension, renal failure, recurrent UGI bleed & worsening hypoxia using higher FIO2. PCCM was called back due to concern for imminent arrest.    SUBJECTIVE:  Unresponsive    VITAL SIGNS: BP 98/72 mmHg  Pulse 116  Temp(Src) 98.4 F (36.9 C) (Oral)  Resp 24  Ht 5\' 7"  (1.702 m)  Wt 169 lb 15.6 oz (77.1 kg)  BMI 26.62 kg/m2  SpO2 99%  HEMODYNAMICS:    VENTILATOR SETTINGS:    INTAKE / OUTPUT: I/O last 3 completed shifts: In: 5015 [I.V.:4590; IV Piggyback:425] Out: 430 [Urine:430]  PHYSICAL EXAMINATION: General: acute on Chronically ill appearing, male unresponsive. Snoring resp efforts HENT: NCAT OP w/ blood both coffee ground and bright red PULM: diffuse rhonchi + tactile frem .  CV: RRR, no mgr GI: distended but soft, buldging flanks Derm: palmar erythema noted Neuro: arouses to voice, does not focus on examiner,moves all four extremities   LABS:  BMET  Recent Labs Lab 07/06/15 0445 07/07/15 0455 May 30, 2015 0557  NA 160* 153* 144  K 4.4 3.9 3.9  CL 127* 123* 116*  CO2 22 21* 20*  BUN 23* 23* 31*  CREATININE 1.18 1.50* 2.02*  GLUCOSE 182* 173* 161*    Electrolytes  Recent Labs Lab 07/06/15 0445 07/07/15 0455 May 30, 2015 0557  CALCIUM 7.9* 7.7* 7.7*  MG 1.9 1.7 1.6*  PHOS 1.9* 1.9* 3.5    CBC  Recent Labs Lab 07/03/15 0545 07/05/15 0522 May 30, 2015 1045  WBC 15.4* 13.2* 15.7*  HGB  8.2* 8.6* 9.1*  HCT 24.7* 27.8* 27.8*  PLT 78* 94* 137*    Coag's  Recent Labs Lab 07/02/15 0925  07/06/15 0445 07/07/15 0455 May 30, 2015 0557  APTT 35  --   --   --   --   INR 2.08*  < > 1.94* 1.91* 1.95*  < > = values in this interval not displayed.  Sepsis Markers No results for input(s): LATICACIDVEN, PROCALCITON, O2SATVEN in the last 168 hours.  ABG  Recent Labs Lab May 30, 2015 1121  PHART 7.432  PCO2ART 26.7*  PO2ART 61.0*    Liver Enzymes  Recent Labs Lab 07/06/15 0445 07/07/15 0455 May 30, 2015 0557  AST 98* 82* 78*  ALT 111* 89* 76*  ALKPHOS 85 89 85  BILITOT 10.9* 10.7* 12.8*  ALBUMIN 2.2* 2.0* 2.0*    Cardiac Enzymes No results for input(s): TROPONINI, PROBNP in the last 168 hours.  Glucose  Recent Labs Lab 07/07/15 1538 07/07/15 2011 07/07/15 2346 May 30, 2015 0336 May 30, 2015 0719 May 30, 2015 1224  GLUCAP 154* 152* 169* 209* 118* 151*    Imaging [place] Renal  2015/11/02  CLINICAL DATA:  45 year old male with acute renal failure. EXAM: RENAL / URINARY TRACT ULTRASOUND COMPLETE COMPARISON:  06/21/2015 08/10/2009 ultrasounds. FINDINGS: Right Kidney: Length: 11.2 cm. Echogenicity within normal limits. No mass or hydronephrosis visualized. Left Kidney: Length: 11.1 cm. Echogenicity within normal limits. No mass or hydronephrosis visualized. Bladder: A Foley catheter is present within the bladder. Ascites and cirrhosis again  identified. IMPRESSION: Unremarkable kidneys.  No evidence of hydronephrosis. Electronically Signed   By: Harmon Pier M.D.   On: 07/07/2015 14:50     STUDIES:  EGD 2/23>>> portal gastropathy likely source of bleeding. Esophageal varices noted.  EGD 3/2>>> varices noted, no active bleeding, banding performed  CULTURES: BC x 2 3/1>>> Urine culture >> (-)  ANTIBIOTICS: Ceftriaxone 3/1>>>d/c'd  SIGNIFICANT EVENTS:   LINES/TUBES: R IJ Cortis/CVL 3/1>>>   ASSESSMENT / PLAN:  COMA: mix of hepatic encephalopathy and metabolic  derangements.  Recurrent Upper GI bleed presumed due to esophageal varices  ETOH cirrhosis  Ascites Hepatic encephalopathy Acute Hypoxic Respiratory Failure  MODS Hypotension  AKI Blood loss anemia  Coagulopathy - r/t cirrhosis   Discussion Pt is actively dying. Spoke w/ family at length. Not a candidate for HD. Now in multiple organ failure. Do not think that he will survive this illness and life support will do nothing more than prolong the dying process and provide discomfort. They understand and are in agreement with the plan to not escalate. Made it clear he will not survive this hospital stay  Plan Full DNR PRN morphine  Encouraged family to be at bedside.  Primary care team notified   Simonne Martinet ACNP-BC [facility] Pulmonary/Critical Care Pager # [phone] OR # [phone] if no answer  07/21/2015, 3:06 PM

## 2015-07-08 NOTE — Consult Note (Addendum)
[facility] Consultation Note Requesting Physician:  Dr. Joseph Art Reason for Consult:  Progressive oliguric renal failure  HPI: The patient is a 45 y.o. year-old WM with a background of alcohol abuse, tobacco abuse, cirrhosis with portal gastropathy/varices. Was brought in by his mother because of coffee ground emesis (had just been discharged 5 days prior after presenting with melena and worsening abdominal distension; during that admission had US showing cirrhotic liver with ? mass), 5.1 liter paracentesis (06/21/15), coagulopathy.  Did little at home other than lie in bed and was again drinking.   On admission was started on octreotide, protonix, vasopressin drips, received volume, was transfused with PRBC's (for Hb 4.8) and  and FFP (for INR 4). Has received IVF boluses/infusions. 10 liter + fluid balance. Blood pressures intermittently into 80-90 range. Has been on ETOH withdrawal protocol. Progressive encephalopathy.   Octreotide 3/1-3/3 Vasopressin 3/1-3/3  Progressive oliguria since 3/6, creatinine has increased since then from 1->2. We are asked to see.   CREATININE TRENDING AS FOLLOWS:  Date/Time Value Ref Range Status  07/27/2015 05:57 AM 2.02* 0.61 - 1.24 mg/dL Final  44/06/4740 59:56 AM 1.50* 0.61 - 1.24 mg/dL Final  38/75/6433 29:51 AM 1.18 0.61 - 1.24 mg/dL Final  88/41/6606 30:16 AM 0.93 0.61 - 1.24 mg/dL Final  05/08/3233 57:32 AM 1.01 0.61 - 1.24 mg/dL Final  20/25/4270 62:37 PM 0.96 0.61 - 1.24 mg/dL Final  62/83/1517 61:60 PM 0.93 0.61 - 1.24 mg/dL Final  73/71/0626 94:85 AM 1.10 0.61 - 1.24 mg/dL Final  46/27/0350 09:38 PM 1.29*  Vasopressin and octreotide stopped 0.61 - 1.24 mg/dL Final  18/29/9371 69:67 PM 1.42* 0.61 - 1.24 mg/dL Final  89/38/1017 51:02 AM 1.75* 0.61 - 1.24 mg/dL Final  58/52/7782 42:35 PM 2.09* 0.61 - 1.24 mg/dL Final  36/14/4315 40:08 AM 3.03* 0.61 - 1.24 mg/dL Final  67/61/9509 32:67 AM 4.04* 0.61 - 1.24 mg/dL Final    Past Medical History  Diagnosis  Date  . Hypertension   . ETOH abuse   . Cirrhosis (HCC)   . GI bleed     Past Surgical History:  Past Surgical History  Procedure Laterality Date  . Tonsillectomy    . Esophagogastroduodenoscopy (egd) with propofol N/A 06/23/2015    Procedure: ESOPHAGOGASTRODUODENOSCOPY (EGD) WITH PROPOFOL;  Surgeon: Willis Modena, MD;  Location: [facility] ENDOSCOPY;  Service: Endoscopy;  Laterality: N/A;  . Esophagogastroduodenoscopy N/A 07/20/2015    Procedure: ESOPHAGOGASTRODUODENOSCOPY (EGD);  Surgeon: Carman Ching, MD;  Location: [facility] ENDOSCOPY;  Service: Endoscopy;  Laterality: N/A;  banding of varices  bedside; pt intubated and sedated     Family History  Problem Relation Age of Onset  . Hypertension Father    Social History:  reports that he has been smoking Cigarettes.  He has never used smokeless tobacco. He reports that he drinks alcohol. He reports that he uses illicit drugs (Marijuana).  Allergies: No Known Allergies  Home medications: Prior to Admission medications   Medication Sig Start Date End Date Taking? Authorizing Provider  ciprofloxacin (CIPRO) 500 MG tablet Take 1 tablet (500 mg total) by mouth 2 (two) times daily. 06/24/15  Yes Joseph Art, DO  furosemide (LASIX) 40 MG tablet Take 1 tablet (40 mg total) by mouth daily. 06/24/15  Yes Jessica U Vann, DO  pantoprazole (PROTONIX) 40 MG tablet Take 1 tablet (40 mg total) by mouth daily. 06/24/15  Yes Joseph Art, DO  propranolol (INDERAL) 10 MG tablet Take 1 tablet (10 mg total) by mouth 2 (two) times daily. 06/24/15  Yes Joseph ArtJessica U Vann, DO  spironolactone (ALDACTONE) 100 MG tablet Take 1 tablet (100 mg total) by mouth daily. 06/24/15  Yes Joseph ArtJessica U Vann, DO    Inpatient medications: . antiseptic oral rinse  7 mL Mouth Rinse q12n4p  . chlorhexidine  15 mL Mouth Rinse BID  . folic acid  1 mg Intravenous Daily  . insulin aspart  0-20 Units Subcutaneous 6 times per day  . lactulose  30 g Oral BID  . metoprolol  5 mg Intravenous 4  times per day  . pantoprazole (PROTONIX) IV  40 mg Intravenous Q12H  . thiamine  100 mg Intravenous Daily   . dextrose 125 mL/hr at 07/17/2015 1300   Octreotide 3/1-3/3 Vasopressin 3/1-3/3   Review of Systems Pt unable - not responsive  Physical Exam:  BP 100/78 mmHg  Pulse 110  Temp(Src) 98.4 F (36.9 C) (Oral)  Resp 23  Ht 5\' 7"  (1.702 m)  Wt 77.1 kg (169 lb 15.6 oz)  BMI 26.62 kg/m2  SpO2 97%  Systolic BP 80's during my exam  Gen: Jaundiced WM poorly responsive Thin Hyperpneic and tachypneic Skin: no rash. Cyanotic changes over knees, feet Neck: no JVD Chest: Anteriorly clear Heart: Rachy S1S2 No S3 No murmur Abdomen: soft, protuberant, tense ascites Ext: pitting post thigh edema; scd's in place lower legs Neuro: Lethargic, not responsive to me Minimal amount dark brown urine in foley    Recent Labs Lab 07/02/15 1517 07/02/15 1634 07/03/15 0545 07/05/15 0522 07/06/15 0445 07/07/15 0455 07/18/2015 0557  NA 144 146* 147* 158* 160* 153* 144  K 7.0* 3.8 3.9 4.7 4.4 3.9 3.9  CL 113* 111 111 127* 127* 123* 116*  CO2 26 28 24 23 22  21* 20*  GLUCOSE 164* 166* 176* 162* 182* 173* 161*  BUN 27* 27* 21* 18 23* 23* 31*  CREATININE 0.93 0.96 1.01 0.93 1.18 1.50* 2.02*  CALCIUM 6.9* 7.3* 7.6* 7.8* 7.9* 7.7* 7.7*  PHOS <1.0* 1.3* 1.7* 2.3* 1.9* 1.9* 3.5     Recent Labs Lab 07/06/15 0445 07/07/15 0455 07/02/2015 0557  AST 98* 82* 78*  ALT 111* 89* 76*  ALKPHOS 85 89 85  BILITOT 10.9* 10.7* 12.8*  PROT 6.1* 5.9* 6.1*  ALBUMIN 2.2* 2.0* 2.0*    Recent Labs Lab 07/05/15 0525 07/07/15 0455 07/10/2015 1045  AMMONIA 21 32 45*     Recent Labs Lab 07/02/15 0925 07/03/15 0545 07/05/15 0522 07/12/2015 1045  WBC 12.6* 15.4* 13.2* 15.7*  NEUTROABS  --   --   --  11.7*  HGB 7.8* 8.2* 8.6* 9.1*  HCT 23.0* 24.7* 27.8* 27.8*  MCV 96.2 99.6 104.5* 104.1*  PLT 70* 78* 94* 137*      Recent Labs Lab 07/07/15 2011 07/07/15 2346 07/23/2015 0336 07/07/2015 0719  07/19/2015 1224  GLUCAP 152* 169* 209* 118* 151*      Recent Labs Lab 07/06/15 1246  IRON 114  TIBC 130*  FERRITIN 524*    Results for Rudy JewSNOW, Rashied W (MRN [id number]) as of 07/04/2015 13:25  Ref. Range 07/10/2015 11:09  Appearance Latest Ref Range: CLEAR  CLOUDY (A)  Bacteria, UA Latest Ref Range: NONE SEEN  MANY (A)  Bilirubin Urine Latest Ref Range: NEGATIVE  LARGE (A)  Casts Latest Ref Range: NEGATIVE  HYALINE CASTS (A)  Color, Urine Latest Ref Range: YELLOW  ORANGE (A)  Glucose Latest Ref Range: NEGATIVE mg/dL NEGATIVE  Hgb urine dipstick Latest Ref Range: NEGATIVE  NEGATIVE  Ketones, ur Latest Ref Range: NEGATIVE mg/dL  15 (A)  Leukocytes, UA Latest Ref Range: NEGATIVE  MODERATE (A)  Nitrite Latest Ref Range: NEGATIVE  POSITIVE (A)  pH Latest Ref Range: 5.0-8.0  5.0  Protein Latest Ref Range: NEGATIVE mg/dL NEGATIVE  RBC / HPF Latest Ref Range: 0-5 RBC/hpf NONE SEEN  Specific Gravity, Urine Latest Ref Range: 1.005-1.030  1.021  Squamous Epithelial / LPF Latest Ref Range: NONE SEEN  0-5 (A)  WBC, UA Latest Ref Range: 0-5 WBC/hpf 6-30    Xrays/Other Studies: Ct Head Wo Contrast  07/06/2015  CLINICAL DATA:  Altered mental status. EXAM: CT HEAD WITHOUT CONTRAST TECHNIQUE: Contiguous axial images were obtained from the base of the skull through the vertex without intravenous contrast. COMPARISON:  None. FINDINGS: Mild atrophy and white matter changes are advanced for age. Acute cortical infarct, hemorrhage, or mass lesion is present. Ventricles are proportionate to the degree of atrophy. No significant extra-axial fluid collection is present. The paranasal sinuses and mastoid air cells are clear. Atherosclerotic calcifications are present within the cavernous internal carotid arteries and at the dural margin of the right vertebral artery. IMPRESSION: 1. Age advanced atrophy and white matter disease. 2. Atherosclerosis. Electronically Signed   By: Marin Roberts M.D.   On:  07/06/2015 14:28   Background  45 y.o. year-old WM with a background of alcohol abuse, tobacco abuse, cirrhosis with portal gastropathy/varices. Was brought in by his mother because of coffee ground emesis (had just been discharged 5 days prior after presenting with melena and worsening abdominal distension; during that admission had US showing cirrhotic liver with ? mass), 5.1 liter paracentesis (06/21/15), coagulopathy.  Did little at home other than lie in bed and was again drinking.   On re-admission 3/1 was started on octreotide, protonix, vasopressin drips, received volume, was transfused with PRBC's (for Hb 4.8) and  and FFP (for INR 4). Has received IVF boluses/infusions. 10 liter + fluid balance. Blood pressures intermittently into 80-90 range. Has been on ETOH withdrawal protocol. Progressive encephalopathy.  Progressive oliguria since 3/6, creatinine has increased since then from 1->2. We are asked to see.  Assessment/Recommendations  1. Oligoanuric AKI - has occurred in the setting of alcoholic cirrhosis, ascites, portal gastropathy/varices (banded 3/2. Still drinking at back to back admission for bleeding. Now making no urine, BP low, worsening ascites, 10L + fluid balance. Probable precipitant GI bleed (and have not ruled out SBP). Last LVP was in Feb.  1. 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   2. He is not a candidate for transplant due to his ongoing drinking.   3. At this point in time, there is little for me to offer.  4. He is not a candidate for renal replacement therapy.  Dialysis is not an option.   5. The most aggressive measures that could even be considered are norepinephrine or vasopressin plus albumin/midodrine (the latter if could take po)/octreotide/albumin and tap to eval for SBP 6. Best approach would be a palliative one and I agree with Palliative Care Consult.  Alcoholic cirrhosis Ascites Hypotension S/p GI bleed Alcohol abuse Hypernatremia -  correcting with free water Anemia Coagulopathy  Camille Bal, MD [facility] [phone] Pager 07/17/2015, 2:16 PM          Camille Bal,  MD [facility] [phone] pager 06/30/2015, 1:26 PM

## 2015-07-08 NOTE — Progress Notes (Signed)
VASCULAR LAB PRELIMINARY  PRELIMINARY  PRELIMINARY  PRELIMINARY  Bilateral lower extremity venous duplex completed.    Bilateral:  No evidence of DVT, superficial thrombosis, or Baker's Cyst.  Gave result to patient nurse@09 :30 am.  Jenetta Logesami Brailon Don, RVT, RDMS 07-27-2015, 9:40 AM

## 2015-07-08 NOTE — Progress Notes (Addendum)
[place] TEAM 1 - Stepdown/ICU TEAM Progress Note  Derrick [name] WUJ:[id number] DOB: [birth year] DOA: 07-23-15 PCP: No primary care provider on file.  Admit HPI / Brief Narrative: 45 year old WM PMHx ETOH abuse, Tobacco Abuse smoker (1ppd), COPD, HTN, GI bleed,   Admission for GI bleed 2/21 > 2/24 with coffee ground emesis x 2-3 days and melena x 1 week. 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 He returned 3/1 after being found by family lethargic and weak with coffee-ground emesis. No red/bloody vomit or stool per family. Still actively drinking. Last drink was 2/28.    HPI/Subjective: 3/10  A/O  0 patient's eyes are open, only moans. Moans only for pressure to the abdomen in RUQ/LUQ. Follows no commands. This is a distinct downward change from yesterday.       Assessment/Plan:  Upper GI bleed due to esophageal varices  -S/p variceal banding x4  3/2 - bleeding recurred 3/4 necessitating return to octreotide drip, now back off octreotide with no evidence of ongoing bleeding  - continue Protonix 40 mg BID  Acute blood loss anemia  -Hgb appears to be holding steady - nadir was 7.1 - S/P transfused 3U PRBC  -Transfuse for hemoglobin<7   Acute Encephalopathy -Most likely multifactorial to include alcohol withdrawal, hepatic encephalopathy, and metabolic encephalopathy -Ammonia normalized -Patient still has multiple metabolic abnormalities. Hypernatremia?  - pt is now in day 9 of withdrawal but remains very agitated when attempts are made to wean his ativan. Will begin weaning Ativan in A.m.  -A.m. EKG, check for QTC prolongation; if no prolongation will start using antipsychotic for agitation instead of Ativan -Head CT nondiagnostic for cause.  -3/10 Patient's cognition continues to deteriorate obtain brain MRI -DC  CIWA,  ETOH cirrhosis - Ascites - Hepatic encephalopathy -Ammonia normalized as of 3/5   EtOH withdrawal  -See acute encephalopathy  Coagulopathy - r/t cirrhosis  -w/ scheduled vitamin K and 4U FFP his INR has improved to ~2 - follow   At risk SBP with GI bleed, cirrhosis  -Empiric ceftriaxone dosed for 5 days  Hyperglycemia -Felt to be due to octreotide and D5W - A1c was 6.2 06/24/15 so appears to have predisposition for DM  - Continue resistant SSI; reasonably controlled at this time   Hypotension  - resolved  AKI/Oliguric  -Creatinine trending up -3/10  patient oliguric with urine output<455ml 24 hours -Obtain urinalysis with microscopic, urine culture -Renal ultrasound pending -Will await further recommendations from Nephrology  UTI? -Obtain urinalysis and urine culture  PVD -Patient's lower extremities have become cold, mottled -Bilateral LE Doppler R/O DVT  Hypernatremia -Continue D5W 147ml/hr -Start resistant SSI, secondary to starting D5W for hypernatremia  -Spoke with Dr. Donetta Potts GI who stated safe for [place] to place CorTrak tube. As soon as tube placed will decrease D5W and start free water per tube  Hypocalcemia -Corrected calcium= 9.5  Hypokalemia -Resolved   Hypophosphatemia  -Resolved continue to monitor   Hypomagnesemia -Magnesium IV 3 gm     Goals of Care; -Contulted Paliative Care Pt with poor prognosis 2dary MSOF. Eval for paliative care, Short term vs Long ter goal of care. At minimum should be DNR   Code Status: FULL Family Communication: no family present at time of exam Disposition Plan: Resolution encephalopathy    Consultants: Dr. Karn Pickler  GI Dr Daneen [facility]   Procedure/Significant Events: 3/1 re-admit w/ coffee ground emesis 3/1 R IJ cortis  3/2 EGD - ?variceal bleeding, 4 bands placed  s/p transfused 3U PRBC   3/8 CT head WO contrast;Age advanced atrophy and white matter  disease   Culture 3/2 urine negative final 3/8 urine; negative final    Antibiotics: ceftriaxone 3/1 > 3/5   DVT prophylaxis: SCDs   Devices    LINES / TUBES:      Continuous Infusions: . dextrose 125 mL/hr at 07/04/2015 0900    Objective: VITAL SIGNS: Temp: 98.4 F (36.9 C) (03/10 0716) Temp Source: Oral (03/10 0716) BP: 100/78 mmHg (03/10 0900) Pulse Rate: 110 (03/10 0900) SPO2; FIO2:   Intake/Output Summary (Last 24 hours) at Jul 09, 2015 0948 Last data filed at Jul 09, 2015 0900  Gross per 24 hour  Intake   3515 ml  Output    330 ml  Net   3185 ml     Exam: General: A/O  0 patient's eyes are open, only moans to abdominal pain. Follows no commands., No acute respiratory distress Eyes: Negative headache, negative scleral hemorrhage, positive icterus ENT: Negative Runny nose, negative gingival bleeding, Neck:  Negative scars, masses, torticollis, lymphadenopathy, JVD Lungs: Clear to auscultation bilaterally without wheezes or crackles Cardiovascular: Regular rate and rhythm without murmur gallop or rub normal S1 and S2 Abdomen: Positive abdominal pain, positive distention, negative bowel sounds, no rebound, no ascites, no appreciable mass GU: Patient urine still remains extremely dirty. Oliguric Skin; patient's lower extremity becoming mottled Extremities: positive mild to moderate cyanosis, negative clubbing, bilateral lower extremity cold to touch Psychiatric: Unable to assess secondary to altered mental status  Neurologic: Unable to assess secondary to altered mental status      Data Reviewed: Basic Metabolic Panel:  Recent Labs Lab 07/01/15 1300  07/03/15 0545 07/05/15 0522 07/06/15 0445 07/07/15 0455 07/10/2015 0557  NA 136  < > 147* 158* 160* 153* 144  K 2.6*  < > 3.9 4.7 4.4 3.9 3.9  CL 98*  < > 111 127* 127* 123* 116*  CO2 27  < > 21* 20*  GLUCOSE 259*  < > 176* 162* 182* 173* 161*  BUN 60*  < > 21* 18 23* 23* 31*  CREATININE  1.42*  < > 1.01 0.93 1.18 1.50* 2.02*  CALCIUM 7.3*  < > 7.6* 7.8* 7.9* 7.7* 7.7*  MG 2.5*  --  1.7  --  1.9 1.7 1.6*  PHOS 1.3*  < > 1.7* 2.3* 1.9* 1.9* 3.5  < > = values in this interval not displayed. Liver Function Tests:  Recent Labs Lab 07/04/15 0543 07/05/15 0522 07/06/15 0445 07/07/15 0455 07/22/2015 0557  AST 177* 121* 98* 82* 78*  ALT 150* 127* 111* 89* 76*  ALKPHOS 70 76 85 89 85  BILITOT 10.9* 10.8* 10.9* 10.7* 12.8*  PROT 5.4* 6.0* 6.1* 5.9* 6.1*  ALBUMIN 2.3* 2.3* 2.2* 2.0* 2.0*   No results for input(s): LIPASE, AMYLASE in the last 168 hours.  Recent Labs Lab 07/03/15 0545 07/05/15 0525 07/07/15 0455  AMMONIA 25 21 32   CBC:  Recent Labs Lab 07/01/15 1300 07/02/15 0925 07/03/15 0545 07/05/15 0522  WBC 18.0* 12.6* 15.4* 13.2*  NEUTROABS 13.4*  --   --   --   HGB 7.8* 7.8* 8.2* 8.6*  HCT 22.0* 23.0* 24.7* 27.8*  MCV 93.6 96.2 99.6 104.5*  PLT 75* 70* 78* 94*   Cardiac Enzymes: No  results for input(s): CKTOTAL, CKMB, CKMBINDEX, TROPONINI in the last 168 hours. BNP (last 3 results) No results for input(s): BNP in the last 8760 hours.  ProBNP (last 3 results) No results for input(s): PROBNP in the last 8760 hours.  CBG:  Recent Labs Lab 07/07/15 1538 07/07/15 2011 07/07/15 2346 07/22/2015 0336 07/14/2015 0719  GLUCAP 154* 152* 169* 209* 118*    Recent Results (from the past 240 hour(s))  Urine culture     Status: None   Collection Time: 07/23/2015  2:15 AM  Result Value Ref Range Status   Specimen Description URINE, CLEAN CATCH  Final   Special Requests NONE  Final   Culture NO GROWTH 1 DAY  Final   Report Status 07/01/2015 FINAL  Final  Culture, Urine     Status: None   Collection Time: 07/06/15  8:41 AM  Result Value Ref Range Status   Specimen Description URINE, CLEAN CATCH  Final   Special Requests NONE  Final   Culture NO GROWTH 1 DAY  Final   Report Status 07/07/2015 FINAL  Final     Studies:  Recent x-ray studies have been  reviewed in detail by the Attending Physician  Scheduled Meds:  Scheduled Meds: . antiseptic oral rinse  7 mL Mouth Rinse q12n4p  . chlorhexidine  15 mL Mouth Rinse BID  . folic acid  1 mg Intravenous Daily  . insulin aspart  0-20 Units Subcutaneous 6 times per day  . lactulose  30 g Oral BID  . magnesium sulfate 1 - 4 g bolus IVPB  3 g Intravenous Once  . metoprolol  5 mg Intravenous 4 times per day  . pantoprazole (PROTONIX) IV  40 mg Intravenous Q12H  . thiamine  100 mg Intravenous Daily    Time spent on care of this patient: 40 mins   Juline Sanderford, Roselind MessierURTIS J , MD  [facility] Office  [phone] Pager [phone]  On-Call/Text Page:      [email or web address]      password [email or web address]  If 7PM-7AM, please contact night-coverage [email or web address] Password [email or web address] 07/13/2015, 9:48 AM   LOS: 9 days   Care during the described time interval was provided by me .  I have reviewed this patient's available data, including medical history, events of note, physical examination, and all test results as part of my evaluation. I have personally reviewed and interpreted all radiology studies.   Carolyne Littlesurtis Chevez Sambrano, MD [phone] Pager

## 2015-07-08 NOTE — Progress Notes (Signed)
Nutrition Follow-up  DOCUMENTATION CODES:   Severe malnutrition in context of chronic illness  INTERVENTION:    If plans for supportive care, recommend start TF (after Cortrak tube placed) with Vital AF 1.2 at 15 ml/h, increase by 10 ml every 12 hours to goal rate of 75 ml/h to provide 1800 ml, 2160 kcals, 135 gm protein, 1460 ml free water daily. If TF initiated, monitor magnesium, potassium, and phosphorus daily for at least 3 days, MD to replete as needed, as pt is at risk for refeeding syndrome given no intake for > one week and severe PCM.  NUTRITION DIAGNOSIS:   Malnutrition related to chronic illness as evidenced by energy intake < or equal to 75% for > or equal to 1 month, severe depletion of muscle mass, severe fluid accumulation.  Ongoing  GOAL:   Patient will meet greater than or equal to 90% of their needs  Unmet  MONITOR:   Diet advancement, PO intake, Labs, Weight trends, I & O's  REASON FOR ASSESSMENT:   NPO/Clear Liquid Diet, Low Braden    ASSESSMENT:   45 year old male with Hx ETOH abuse, smoker (1ppd), hypertension, and admission for GI bleed 2/21 > 2/24 with coffee ground emesis x 2-3 days and melena x 1 week. 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 He returned 3/1 after being found by family lethargic and weak with coffee-ground emesis.Continues to actively drink, last drink 2/28.  Discussed patient in ICU rounds and with RN today. After consultation with GI team, plans for Cortrak tube placement today for initiation of free water and medications. Noted poor prognosis. Palliative Care Team is following. Per Nephrology, patient is not a candidate for dialysis or CRRT.  Diet Order:  Diet NPO time specified Except for: Sips with Meds  Skin:  Reviewed, no issues  Last BM:  3/8  Height:   Ht  Readings from Last 1 Encounters:  07/02/2015 5\' 7"  (1.702 m)    Weight:   Wt Readings from Last 1 Encounters:  07/20/2015 169 lb 15.6 oz (77.1 kg)    Ideal Body Weight:  67.3 kg  BMI:  Body mass index is 26.62 kg/(m^2).  Estimated Nutritional Needs:   Kcal:  2000-2200  Protein:  100-120 gm  Fluid:  ~2 L  EDUCATION NEEDS:   No education needs identified at this time  Joaquin CourtsKimberly Harris, RD, LDN, CNSC Pager [phone] After Hours Pager [phone]

## 2015-07-08 NOTE — Care Management Note (Signed)
Case Management Note  Patient Details  Name: Rudy JewRobert W Fons MRN: [id number] Date of Birth: 03-Dec-1970  Subjective/Objective:     Pt admitted with Upper GI bleed               Action/Plan:   Pt not following commands.  Palliative Care consulted  Pt is from home independent ; recently discharge for upper GI bleed - pt has cirrhosis and continues to drink.  Mom serves as support person.  CSW assessed pt and provided substance abuse counseling on last admit.  CM will continue to follow for disposition needs  Expected Discharge Date:                  Expected Discharge Plan:  Home/Self Care  In-House Referral:  Clinical Social Work  Discharge planning Services  CM Consult  Post Acute Care Choice:    Choice offered to:     DME Arranged:    DME Agency:     HH Arranged:    HH Agency:     Status of Service:  In process, will continue to follow  Medicare Important Message Given:    Date Medicare IM Given:    Medicare IM give by:    Date Additional Medicare IM Given:    Additional Medicare Important Message give by:     If discussed at Long Length of Stay Meetings, dates discussed:    Additional Comments:  Cherylann ParrClaxton, Seretha Estabrooks S, RN 07/19/2015, 2:26 PM

## 2015-07-08 NOTE — Progress Notes (Signed)
Call placed to Dr. Matthias HughsBuccini .OK with GI to pass an NG/OGT.

## 2015-07-08 NOTE — Progress Notes (Signed)
Discussed my concerns with Dr. Lucretia RoersWood, labs drawn and orders received. Awaiting notification from MRI and CT scan. Will continue to monitor very closely. Family updated as to patient condition.

## 2015-07-08 NOTE — Progress Notes (Signed)
UR Completed. Edeline Greening, RN, BSN.  [phone] 

## 2015-07-08 NOTE — Progress Notes (Signed)
Spoke with Dr. Joseph ArtWoods concerning patient status. Renal function climbing, low Mg on labs this AM, patient unable to take p.o. Medications at this time. Lactulose p.o. Held this AM due to patient unable to take p.o.(s) Dr. Joseph ArtWoods aware, orders received concerning patient issues this morning, Dr. Joseph ArtWoods to discuss plan of care with GI and possibly Renal MD as per our discussion. Will continue to monitor patient very closely.

## 2015-07-08 NOTE — Progress Notes (Signed)
Patient continues to have decreased LOC despite Ativan being held since 19:00 of 3/8; last 2mg  of Ativan given @ 14:19 on Wednesday 3/8. PO Lactulose also being held due to lethargy and inability to follow commands or communicate.

## 2015-07-08 NOTE — Progress Notes (Signed)
Cortrac team advised of patient need for cortac feeding tube.

## 2015-07-08 NOTE — Progress Notes (Signed)
Patient made comfort care as of 15:00 this afternoon; Mr Derrick [name] expired @ 21:15 following withdraw of care. No heart or lung sounds auscultated and verified by Hazel Samshristian Smith, RN. Family (Aunt and cousins) at bedside at time of death. CDS referral called; [facility] on call Elray McgregorMary Lynch, NP notified and signed death certificate. Belongings sent home with family

## 2015-07-08 NOTE — Progress Notes (Signed)
Discussed plan of care with NP Anders SimmondsPete [name]. Patient status now DNR and morphine orders PRN placed. Will continue to provide emotional support to patient and family.

## 2015-07-09 DIAGNOSIS — R400 Somnolence: Secondary | ICD-10-CM

## 2015-07-09 DIAGNOSIS — D62 Acute posthemorrhagic anemia: Secondary | ICD-10-CM

## 2015-07-09 LAB — URINE CULTURE: CULTURE: NO GROWTH

## 2015-07-21 DIAGNOSIS — R4182 Altered mental status, unspecified: Secondary | ICD-10-CM | POA: Insufficient documentation

## 2015-07-21 DIAGNOSIS — D62 Acute posthemorrhagic anemia: Secondary | ICD-10-CM | POA: Insufficient documentation

## 2015-07-30 NOTE — Discharge Summary (Signed)
Death Summary  Derrick [name]:[id number] DOB: 1970-06-28 DOA: 07/15/2015  PCP: No primary care provider on file. PCP/Office notified: No    Admit date: 07-15-2015 Date of Death: 2015-08-06  Final Diagnoses:  Active Problems:   GIB (gastrointestinal bleeding)   Alcohol withdrawal (HCC)   Gastrointestinal hemorrhage associated with other gastritis   COPD (chronic obstructive pulmonary disease) (HCC)   Tobacco abuse   Alcohol abuse   Alcoholic gastritis with hemorrhage   Acute encephalopathy   Hyperglycemia   Acute kidney injury (HCC)   Bleeding esophageal varices (HCC)   Encephalopathy, hepatic (HCC)   Alcoholic cirrhosis of liver without ascites (HCC)   Hypophosphatemia   Hypokalemia   Hypocalcemia   Hypernatremia   PVD (peripheral vascular disease) (HCC)   Alcoholic cirrhosis of liver with ascites (HCC)   Oliguria   Palliative care encounter   Upper GI bleed due to esophageal varices  -S/p variceal banding x4 3/2 - bleeding recurred 3/4 necessitating return to octreotide drip, now back off octreotide with no evidence of ongoing bleeding  - continue Protonix 40 mg BID  Acute blood loss anemia  -Hgb appears to be holding steady - nadir was 7.1 - S/P transfused 3U PRBC  -Transfuse for hemoglobin<7   Acute Encephalopathy -Most likely multifactorial to include alcohol withdrawal, hepatic encephalopathy, and metabolic encephalopathy -Ammonia normalized -Patient still has multiple metabolic abnormalities. Hypernatremia?  - pt is now in day 9 of withdrawal but remains very agitated when attempts are made to wean his ativan. Will begin weaning Ativan in A.m.  -A.m. EKG, check for QTC prolongation; if no prolongation will start using antipsychotic for agitation instead of Ativan -Head CT nondiagnostic for cause.  -3/10 Patient's cognition continues to deteriorate obtain brain MRI -DC CIWA,  ETOH cirrhosis - Ascites - Hepatic encephalopathy -Ammonia normalized as of  3/5   EtOH withdrawal  -See acute encephalopathy  Coagulopathy - r/t cirrhosis  -w/ scheduled vitamin K and 4U FFP his INR has improved to ~2 - follow   At risk SBP with GI bleed, cirrhosis  -Empiric ceftriaxone dosed for 5 days  Hyperglycemia -Felt to be due to octreotide and D5W - A1c was 6.2 06/24/15 so appears to have predisposition for DM  - Continue resistant SSI; reasonably controlled at this time   Hypotension  - resolved  AKI/Oliguric  -Creatinine trending up -3/10 patient oliguric with urine output<417ml 24 hours -Obtain urinalysis with microscopic, urine culture -Renal ultrasound pending -Will await further recommendations from Nephrology  UTI? -Obtain urinalysis and urine culture  PVD -Patient's lower extremities have become cold, mottled -Bilateral LE Doppler R/O DVT  Hypernatremia -Continue D5W 18ml/hr -Start resistant SSI, secondary to starting D5W for hypernatremia  -Spoke with Dr. Donetta Potts GI who stated safe for [place] to place CorTrak tube. As soon as tube placed will decrease D5W and start free water per tube  Hypocalcemia -Corrected calcium= 9.5  Hypokalemia -Resolved   Hypophosphatemia  -Resolved continue to monitor   Hypomagnesemia -Magnesium IV 3 gm    Goals of Care; -Contulted Paliative Care Pt with poor prognosis 2dary MSOF. Eval for paliative care, Short term vs Long ter goal of care. At minimum should be DNR -3/10 patient was made DO NOT RESUSCITATE patient expired at 1500 on 07/27/2015    History of present illness:  45 year old WM PMHx ETOH abuse, Tobacco Abuse smoker (1ppd), COPD, HTN, GI bleed,   Admission for GI bleed 2/21 > 2/24 with coffee ground emesis x 2-3  days and melena x 1 week. 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 He returned 3/1 after being  found by family lethargic and weak with coffee-ground emesis. No red/bloody vomit or stool per family. Still actively drinking. Last drink was 2/28. Patient was treated for MSOF during this stay unfortunately did not respond to aggressive treatment see above note.  Time: 1500  Signed:  Carolyne Littlesurtis Felice Deem, MD [facility] [phone]

## 2015-07-30 DEATH — deceased

## 2015-08-05 LAB — ACID FAST CULTURE WITH REFLEXED SENSITIVITIES (MYCOBACTERIA): Acid Fast Culture: NEGATIVE

## 2016-10-16 IMAGING — US US ABDOMEN LIMITED
1 series · 13 of 25 positions shown · non-contrast
Comparison: 06/21/2015 CT abdomen/pelvis.

CLINICAL DATA: Abdominal distention. Elevated liver function tests.
Ascites. Post paracentesis earlier today.

EXAM:
US ABDOMEN LIMITED - RIGHT UPPER QUADRANT

[Series 1: us abdomen limited · 0.28mm/px · 13 of 56 slices shown]
[im 1/56]
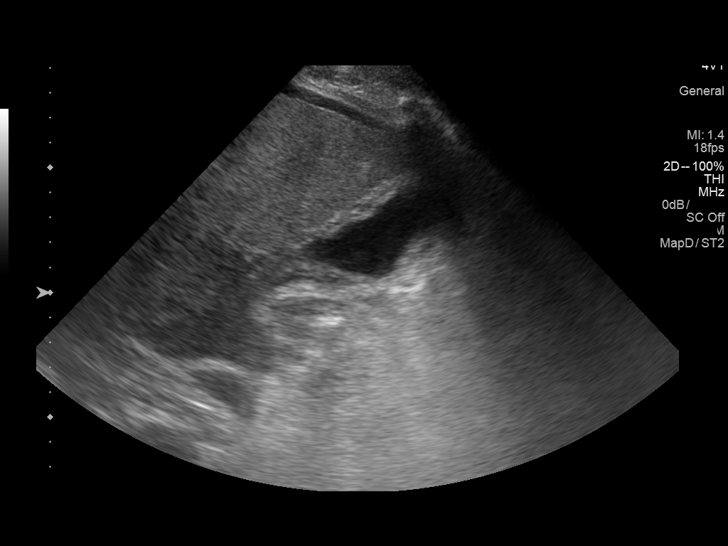
[im 5/56]
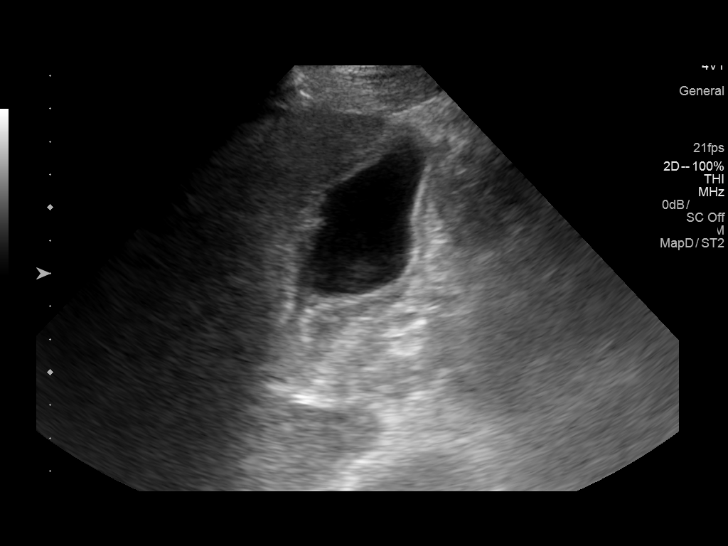
[im 10/56]
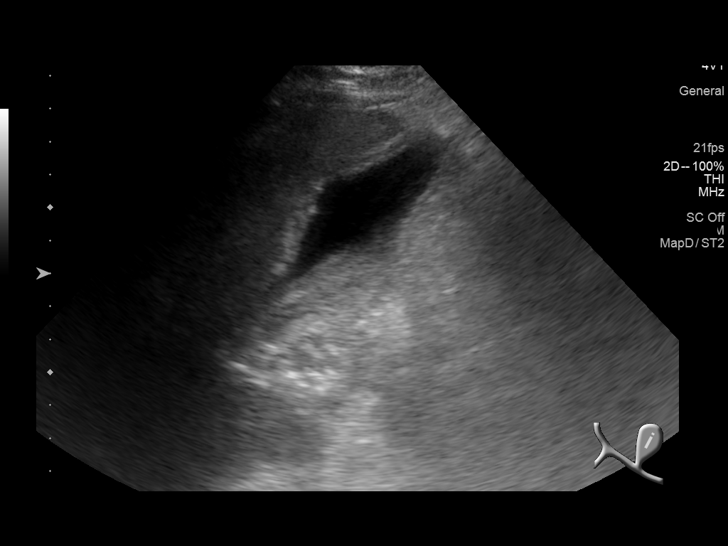
[im 14/56]
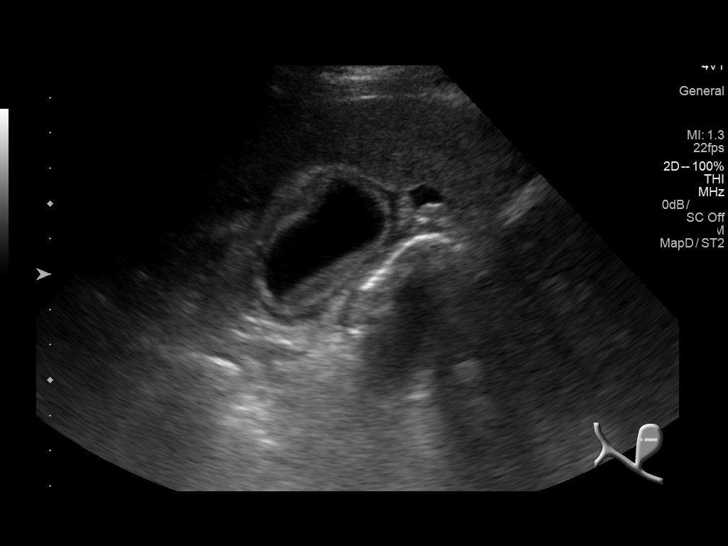
[im 19/56]
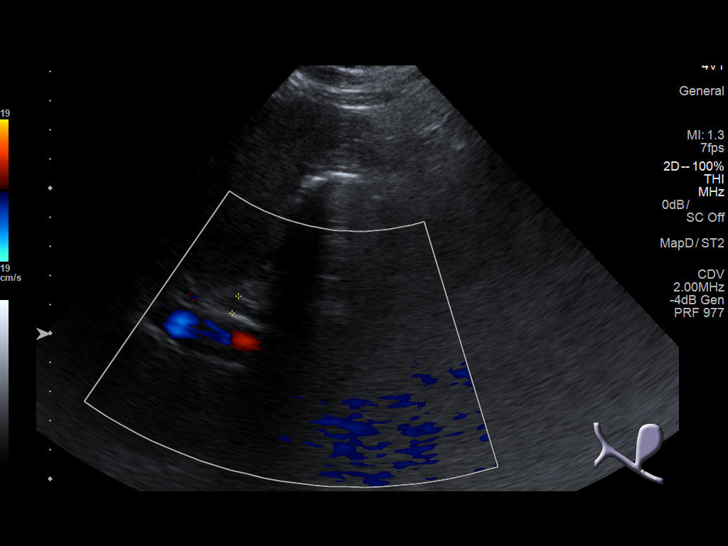
[im 23/56]
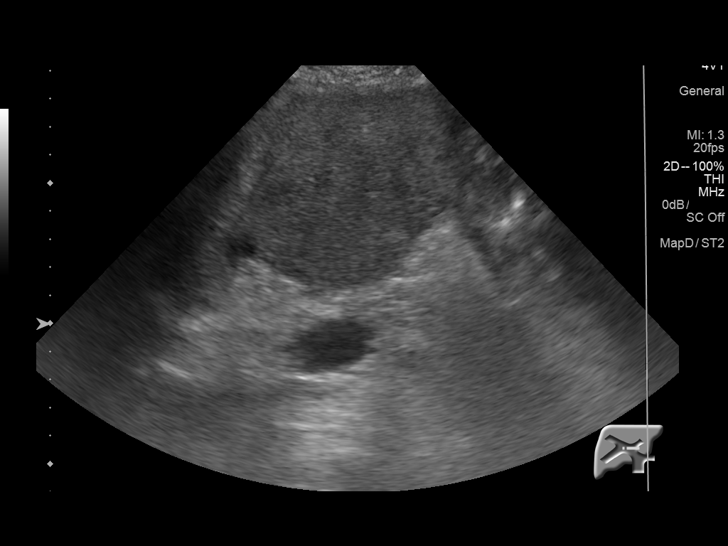
[im 28/56]
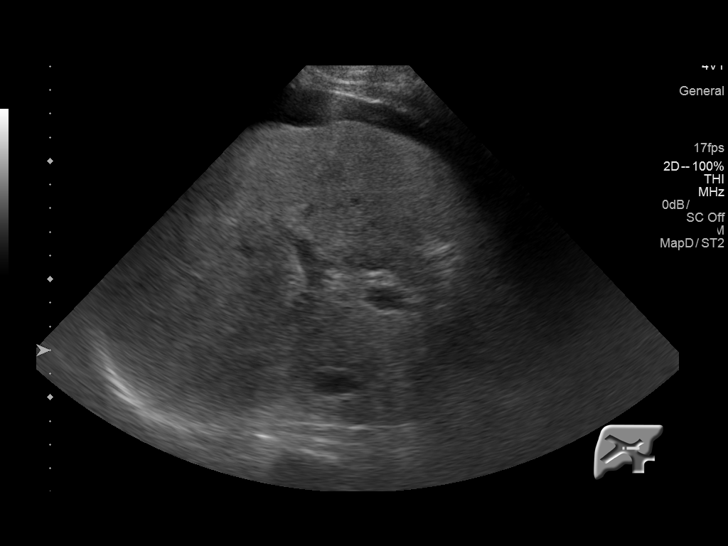
[im 33/56]
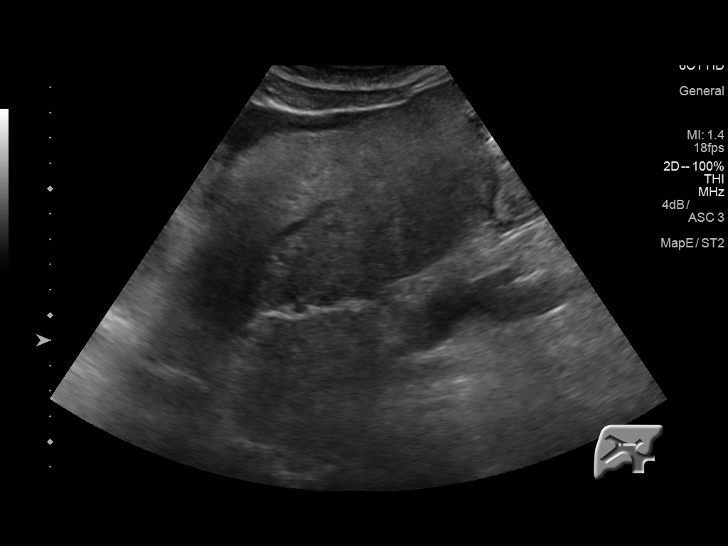
[im 37/56]
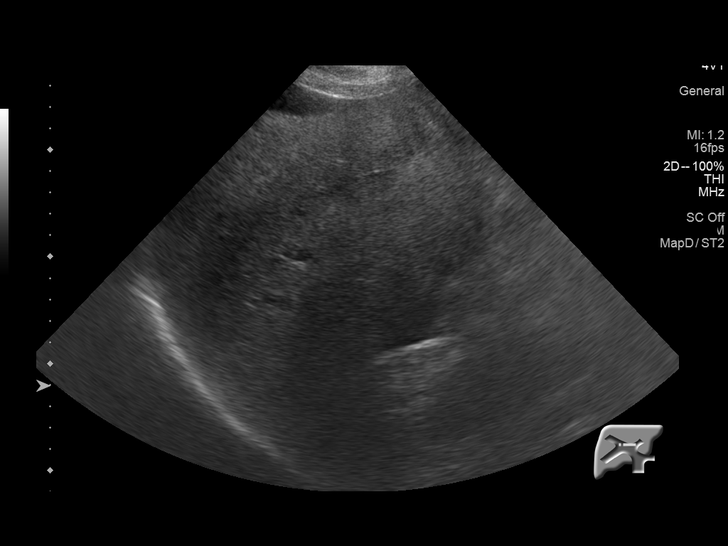
[im 42/56]
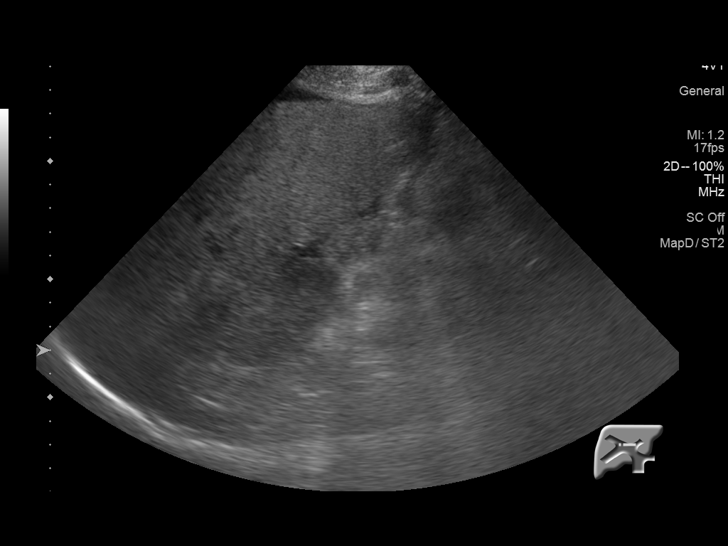
[im 46/56]
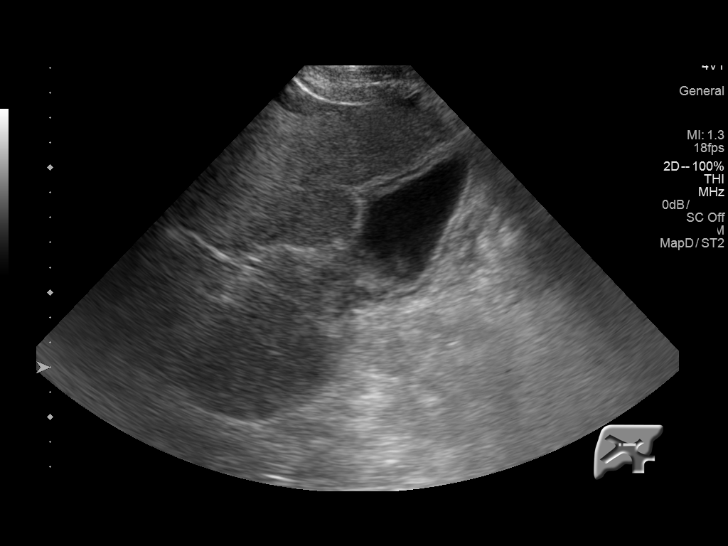
[im 51/56]
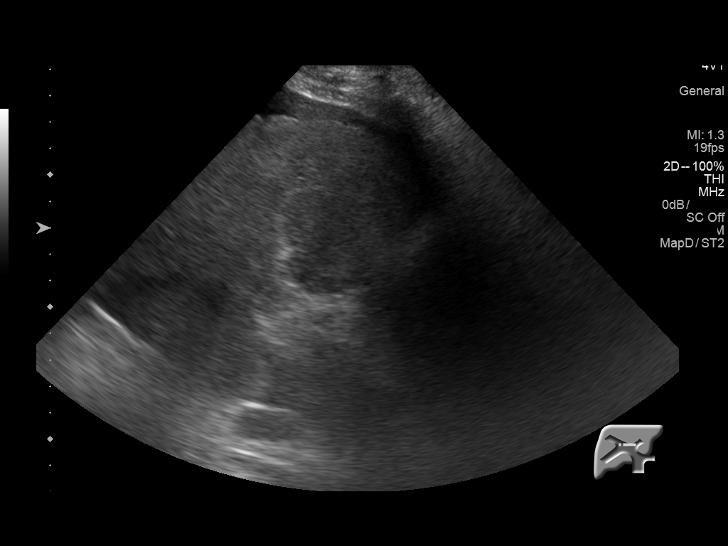
[im 56/56]
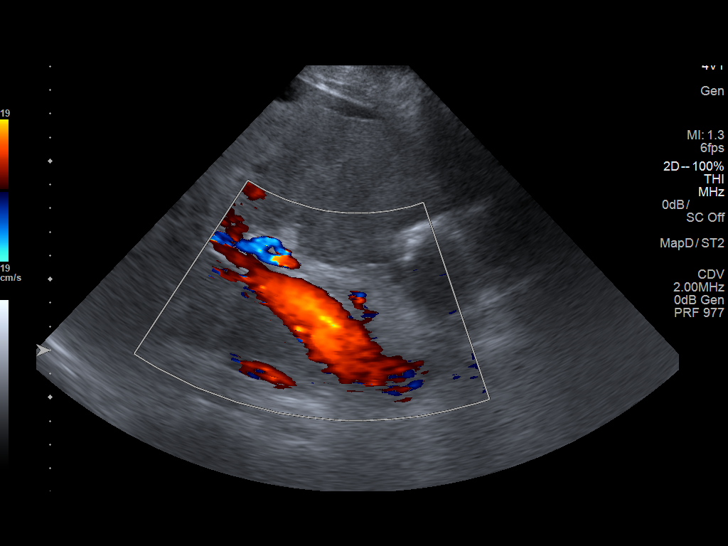

[13 of 25 positions shown; findings below may reference images not displayed]

FINDINGS: Gallbladder:

Nondistended gallbladder. Moderate diffuse gallbladder wall
thickening (6 mm in thickness). Layering sludge. No evidence of
cholelithiasis. Pericholecystic fluid. No sonographic Murphy sign.

Common bile duct:

Diameter: 5 mm

Liver:

Liver surface is diffusely finely irregular and the liver parenchyma
is diffusely coarsened in echotexture, in keeping with cirrhosis.
There is a hypoechoic 3.4 x 3.7 x 3.4 cm focus measured by the
technologist in the central liver, which is indeterminate, cannot
exclude a liver mass in this location. The main portal vein is
patent with appropriate flow direction. There is small to moderate
perihepatic ascites.
IMPRESSION: 1. Cirrhosis.
2. Indeterminate hypoechoic 3.7 cm focus in the central liver,
cannot exclude a liver mass. Recommend correlation with serum AFP. A
liver mass protocol MRI abdomen with and without IV contrast should
be performed on a short term outpatient basis for further
evaluation. If MRI is not feasible or contraindicated, a triphasic
protocol CT of the abdomen with and without IV contrast is
recommended.
3. Small to moderate volume perihepatic ascites.
4. Nondistended thick-walled gallbladder with layering sludge. No
cholelithiasis. No sonographic Murphy sign. The gallbladder wall
thickening is likely reactive or due to non inflammatory edema (i.e.
due to portal hypertension and/or hypoalbuminemia).
5. No biliary ductal dilatation.

## 2017-05-20 IMAGING — CR DG CHEST 1V PORT
1 series · 1 of 1 positions shown · non-contrast
Comparison: Chest x-ray of 06/29/2015

CLINICAL DATA: Gastrointestinal tract bleeding, placement of
central venous line

EXAM:
PORTABLE CHEST 1 VIEW

[AP]
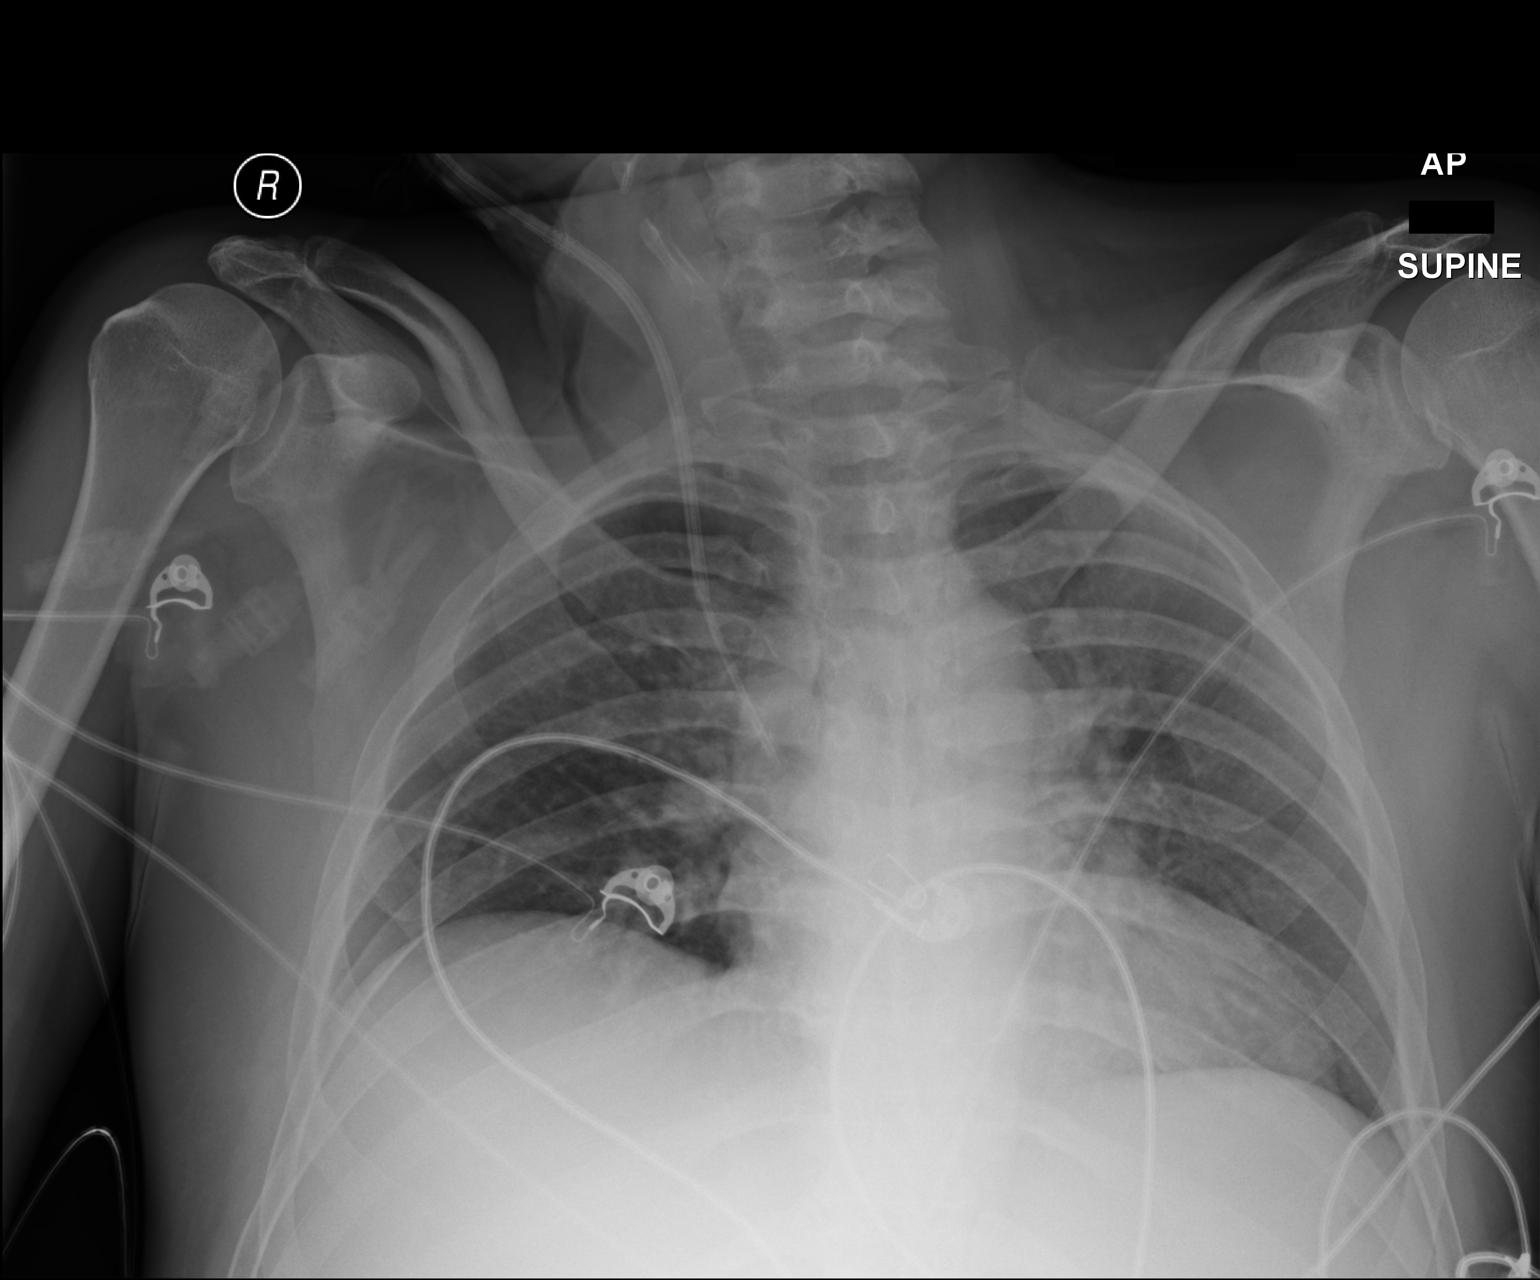

[1 of 1 positions shown; findings below may reference images not displayed]

FINDINGS: A right IJ central venous line is present with the tip overlying the
mid SVC. No pneumothorax is seen. The lungs are not well aerated
with volume loss bilaterally. The heart is within normal limits in
size. No bony abnormality is seen.
IMPRESSION: Right IJ central venous line tip overlies the mid SVC. No
pneumothorax pre
